# Patient Record
Sex: Female | Born: 1935 | Race: White | Hispanic: No | Marital: Married | Smoking: Never smoker
Health system: Southern US, Community
[De-identification: ages and names within clinical notes are randomized; demographics above are authoritative.]

## PROBLEM LIST (undated history)

## (undated) DIAGNOSIS — Z86718 Personal history of other venous thrombosis and embolism: Secondary | ICD-10-CM

## (undated) DIAGNOSIS — E039 Hypothyroidism, unspecified: Secondary | ICD-10-CM

---

## 2000-06-12 ENCOUNTER — Encounter: Payer: Self-pay | Admitting: Emergency Medicine

## 2000-06-12 ENCOUNTER — Emergency Department (HOSPITAL_COMMUNITY): Admission: EM | Admit: 2000-06-12 | Discharge: 2000-06-12 | Payer: Self-pay | Admitting: Emergency Medicine

## 2004-07-20 ENCOUNTER — Inpatient Hospital Stay: Payer: Self-pay | Admitting: Internal Medicine

## 2004-11-04 ENCOUNTER — Ambulatory Visit: Payer: Self-pay | Admitting: Internal Medicine

## 2005-11-06 ENCOUNTER — Ambulatory Visit: Payer: Self-pay | Admitting: Internal Medicine

## 2006-09-21 ENCOUNTER — Ambulatory Visit: Payer: Self-pay | Admitting: Unknown Physician Specialty

## 2006-11-12 ENCOUNTER — Ambulatory Visit: Payer: Self-pay | Admitting: Internal Medicine

## 2007-11-14 ENCOUNTER — Ambulatory Visit: Payer: Self-pay | Admitting: Internal Medicine

## 2008-11-16 ENCOUNTER — Ambulatory Visit: Payer: Self-pay | Admitting: Internal Medicine

## 2009-11-18 ENCOUNTER — Ambulatory Visit: Payer: Self-pay | Admitting: Internal Medicine

## 2010-11-21 ENCOUNTER — Ambulatory Visit: Payer: Self-pay | Admitting: Internal Medicine

## 2011-11-22 ENCOUNTER — Ambulatory Visit: Payer: Self-pay | Admitting: Internal Medicine

## 2012-05-30 ENCOUNTER — Emergency Department: Payer: Self-pay | Admitting: Emergency Medicine

## 2012-05-30 LAB — COMPREHENSIVE METABOLIC PANEL
Alkaline Phosphatase: 88 U/L (ref 50–136)
Anion Gap: 5 — ABNORMAL LOW (ref 7–16)
BUN: 15 mg/dL (ref 7–18)
EGFR (African American): >60
EGFR (Non-African Amer.): 59 — ABNORMAL LOW
Glucose: 128 mg/dL — ABNORMAL HIGH (ref 65–99)
Osmolality: 282 (ref 275–301)
Potassium: 3.8 mmol/L (ref 3.5–5.1)

## 2012-05-30 LAB — CBC
MCH: 29 pg (ref 26.0–34.0)
MCV: 87 fL (ref 80–100)
Platelet: 198 10*3/uL (ref 150–440)
WBC: 5.7 10*3/uL (ref 3.6–11.0)

## 2012-05-30 LAB — TSH: Thyroid Stimulating Horm: 2.04 u[IU]/mL

## 2012-10-27 ENCOUNTER — Inpatient Hospital Stay (HOSPITAL_COMMUNITY): Payer: Medicare Other | Admitting: Certified Registered"

## 2012-10-27 ENCOUNTER — Inpatient Hospital Stay (HOSPITAL_COMMUNITY)
Admission: EM | Admit: 2012-10-27 | Discharge: 2012-10-29 | DRG: 494 | Disposition: A | Discharge status: Skilled Nursing Facility | Payer: Medicare Other | Attending: Orthopedic Surgery | Admitting: Orthopedic Surgery

## 2012-10-27 ENCOUNTER — Encounter (HOSPITAL_COMMUNITY): Payer: Self-pay | Admitting: Certified Registered"

## 2012-10-27 ENCOUNTER — Encounter (HOSPITAL_COMMUNITY): Payer: Self-pay | Admitting: Physician Assistant

## 2012-10-27 ENCOUNTER — Other Ambulatory Visit: Payer: Self-pay

## 2012-10-27 ENCOUNTER — Emergency Department (HOSPITAL_COMMUNITY): Payer: Medicare Other

## 2012-10-27 ENCOUNTER — Encounter (HOSPITAL_COMMUNITY)
Admission: EM | Disposition: A | Discharge status: Skilled Nursing Facility | Payer: Self-pay | Source: Home / Self Care | Attending: Orthopedic Surgery

## 2012-10-27 DIAGNOSIS — E039 Hypothyroidism, unspecified: Secondary | ICD-10-CM | POA: Insufficient documentation

## 2012-10-27 DIAGNOSIS — F411 Generalized anxiety disorder: Secondary | ICD-10-CM | POA: Diagnosis present

## 2012-10-27 DIAGNOSIS — S82001A Unspecified fracture of right patella, initial encounter for closed fracture: Secondary | ICD-10-CM

## 2012-10-27 DIAGNOSIS — W010XXA Fall on same level from slipping, tripping and stumbling without subsequent striking against object, initial encounter: Secondary | ICD-10-CM | POA: Diagnosis present

## 2012-10-27 DIAGNOSIS — Y9229 Other specified public building as the place of occurrence of the external cause: Secondary | ICD-10-CM

## 2012-10-27 DIAGNOSIS — Z79899 Other long term (current) drug therapy: Secondary | ICD-10-CM

## 2012-10-27 DIAGNOSIS — S82009A Unspecified fracture of unspecified patella, initial encounter for closed fracture: Principal | ICD-10-CM | POA: Diagnosis present

## 2012-10-27 HISTORY — DX: Hypothyroidism, unspecified: E03.9

## 2012-10-27 HISTORY — PX: ORIF PATELLA: SHX5033

## 2012-10-27 LAB — CBC WITH DIFFERENTIAL/PLATELET
Basophils Absolute: 0 10*3/uL (ref 0.0–0.1)
Basophils Relative: 0 % (ref 0–1)
Hemoglobin: 12.6 g/dL (ref 12.0–15.0)
Lymphocytes Relative: 20 % (ref 12–46)
MCHC: 32.7 g/dL (ref 30.0–36.0)
Neutro Abs: 5.1 10*3/uL (ref 1.7–7.7)
Neutrophils Relative %: 74 % (ref 43–77)
RDW: 13.4 % (ref 11.5–15.5)
WBC: 6.8 10*3/uL (ref 4.0–10.5)

## 2012-10-27 LAB — CBC
HCT: 38.8 % (ref 36.0–46.0)
Hemoglobin: 12.4 g/dL (ref 12.0–15.0)
MCH: 26.8 pg (ref 26.0–34.0)
MCHC: 32 g/dL (ref 30.0–36.0)
MCV: 83.8 fL (ref 78.0–100.0)

## 2012-10-27 LAB — APTT: aPTT: 26 seconds (ref 24–37)

## 2012-10-27 LAB — POCT I-STAT, CHEM 8
Calcium, Ion: 1.12 mmol/L — ABNORMAL LOW (ref 1.13–1.30)
HCT: 38 % (ref 36.0–46.0)
Hemoglobin: 12.9 g/dL (ref 12.0–15.0)
TCO2: 23 mmol/L (ref 0–100)

## 2012-10-27 LAB — PROTIME-INR: INR: 1.01 (ref 0.00–1.49)

## 2012-10-27 SURGERY — OPEN REDUCTION INTERNAL FIXATION (ORIF) PATELLA
Anesthesia: General | Site: Knee | Laterality: Right | Wound class: Clean

## 2012-10-27 MED ORDER — FLEET ENEMA 7-19 GM/118ML RE ENEM: 1.0000 | ENEMA | Freq: Once | RECTAL | Status: AC | PRN

## 2012-10-27 MED ORDER — SODIUM CHLORIDE 0.9 % IV SOLN
INTRAVENOUS | Status: DC | PRN
  Administered 2012-10-27: 16:00:00 via INTRAVENOUS

## 2012-10-27 MED ORDER — PROPOFOL 10 MG/ML IV BOLUS
INTRAVENOUS | Status: DC | PRN
  Administered 2012-10-27: 150 mg via INTRAVENOUS
  Administered 2012-10-27: 20 mg via INTRAVENOUS

## 2012-10-27 MED ORDER — HYDROCODONE-ACETAMINOPHEN 5-325 MG PO TABS
1.0000 | ORAL_TABLET | ORAL | Status: DC | PRN
  Administered 2012-10-28 – 2012-10-29 (×5): 1 via ORAL
  Filled 2012-10-27 (×2): qty 1

## 2012-10-27 MED ORDER — HYDROMORPHONE HCL PF 1 MG/ML IJ SOLN: 0.5000 mg | INTRAMUSCULAR | Status: DC | PRN

## 2012-10-27 MED ORDER — LACTATED RINGERS IV SOLN
INTRAVENOUS | Status: DC | PRN
  Administered 2012-10-27: 17:00:00 via INTRAVENOUS

## 2012-10-27 MED ORDER — CEFAZOLIN SODIUM-DEXTROSE 2-3 GM-% IV SOLR
2.0000 g | INTRAVENOUS | Status: DC
  Filled 2012-10-27: qty 50

## 2012-10-27 MED ORDER — LEVOTHYROXINE SODIUM 25 MCG PO TABS
25.0000 ug | ORAL_TABLET | Freq: Every day | ORAL | Status: DC
  Administered 2012-10-28 – 2012-10-29 (×2): 25 ug via ORAL
  Filled 2012-10-27 (×3): qty 1

## 2012-10-27 MED ORDER — SENNOSIDES-DOCUSATE SODIUM 8.6-50 MG PO TABS: 1.0000 | ORAL_TABLET | Freq: Every evening | ORAL | Status: DC | PRN

## 2012-10-27 MED ORDER — ENOXAPARIN SODIUM 40 MG/0.4ML ~~LOC~~ SOLN
40.0000 mg | SUBCUTANEOUS | Status: DC
  Administered 2012-10-28 – 2012-10-29 (×2): 40 mg via SUBCUTANEOUS
  Filled 2012-10-27 (×3): qty 0.4

## 2012-10-27 MED ORDER — LIDOCAINE HCL (CARDIAC) 20 MG/ML IV SOLN
INTRAVENOUS | Status: DC | PRN
  Administered 2012-10-27: 25 mg via INTRAVENOUS

## 2012-10-27 MED ORDER — CEFAZOLIN SODIUM 1-5 GM-% IV SOLN
1.0000 g | Freq: Four times a day (QID) | INTRAVENOUS | Status: AC
  Administered 2012-10-27 – 2012-10-28 (×3): 1 g via INTRAVENOUS
  Filled 2012-10-27 (×3): qty 50

## 2012-10-27 MED ORDER — ONDANSETRON HCL 4 MG/2ML IJ SOLN: 4.0000 mg | Freq: Once | INTRAMUSCULAR | Status: DC | PRN

## 2012-10-27 MED ORDER — LORAZEPAM 0.5 MG PO TABS
0.5000 mg | ORAL_TABLET | Freq: Three times a day (TID) | ORAL | Status: DC | PRN
  Administered 2012-10-27: 0.5 mg via ORAL
  Filled 2012-10-27: qty 1

## 2012-10-27 MED ORDER — FENTANYL CITRATE 0.05 MG/ML IJ SOLN
INTRAMUSCULAR | Status: DC | PRN
  Administered 2012-10-27 (×4): 25 ug via INTRAVENOUS

## 2012-10-27 MED ORDER — ONDANSETRON HCL 4 MG PO TABS: 4.0000 mg | ORAL_TABLET | Freq: Four times a day (QID) | ORAL | Status: DC | PRN

## 2012-10-27 MED ORDER — ONDANSETRON HCL 4 MG/2ML IJ SOLN
4.0000 mg | Freq: Four times a day (QID) | INTRAMUSCULAR | Status: DC | PRN
  Administered 2012-10-27: 4 mg via INTRAVENOUS
  Filled 2012-10-27: qty 2

## 2012-10-27 MED ORDER — BUPIVACAINE HCL (PF) 0.5 % IJ SOLN
INTRAMUSCULAR | Status: AC
  Filled 2012-10-27: qty 30

## 2012-10-27 MED ORDER — BISACODYL 10 MG RE SUPP: 10.0000 mg | Freq: Every day | RECTAL | Status: DC | PRN

## 2012-10-27 MED ORDER — HYDROCODONE-ACETAMINOPHEN 5-325 MG PO TABS
1.0000 | ORAL_TABLET | Freq: Four times a day (QID) | ORAL | Status: DC | PRN
  Filled 2012-10-27 (×3): qty 1

## 2012-10-27 MED ORDER — BUPIVACAINE HCL (PF) 0.5 % IJ SOLN
INTRAMUSCULAR | Status: DC | PRN
  Administered 2012-10-27: 20 mL

## 2012-10-27 MED ORDER — HYDROMORPHONE HCL PF 1 MG/ML IJ SOLN: 0.2500 mg | INTRAMUSCULAR | Status: DC | PRN

## 2012-10-27 MED ORDER — METOCLOPRAMIDE HCL 10 MG PO TABS: 5.0000 mg | ORAL_TABLET | Freq: Three times a day (TID) | ORAL | Status: DC | PRN

## 2012-10-27 MED ORDER — SODIUM CHLORIDE 0.9 % IV SOLN
INTRAVENOUS | Status: DC
  Administered 2012-10-27: 21:00:00 via INTRAVENOUS

## 2012-10-27 MED ORDER — SUFENTANIL CITRATE 50 MCG/ML IV SOLN
INTRAVENOUS | Status: DC | PRN
  Administered 2012-10-27: 10 ug via INTRAVENOUS

## 2012-10-27 MED ORDER — CEFAZOLIN SODIUM-DEXTROSE 2-3 GM-% IV SOLR
INTRAVENOUS | Status: DC | PRN
  Administered 2012-10-27: 2 g via INTRAVENOUS

## 2012-10-27 MED ORDER — ENOXAPARIN SODIUM 40 MG/0.4ML ~~LOC~~ SOLN
40.0000 mg | SUBCUTANEOUS | Status: DC
  Filled 2012-10-27: qty 0.4

## 2012-10-27 MED ORDER — ACETAMINOPHEN 10 MG/ML IV SOLN: 1000.0000 mg | Freq: Once | INTRAVENOUS | Status: DC | PRN

## 2012-10-27 MED ORDER — DOCUSATE SODIUM 100 MG PO CAPS
100.0000 mg | ORAL_CAPSULE | Freq: Two times a day (BID) | ORAL | Status: DC
  Administered 2012-10-27 – 2012-10-29 (×4): 100 mg via ORAL
  Filled 2012-10-27 (×6): qty 1

## 2012-10-27 MED ORDER — METOCLOPRAMIDE HCL 5 MG/ML IJ SOLN: 5.0000 mg | Freq: Three times a day (TID) | INTRAMUSCULAR | Status: DC | PRN

## 2012-10-27 SURGICAL SUPPLY — 66 items
BANDAGE ELASTIC 4 VELCRO ST LF (GAUZE/BANDAGES/DRESSINGS) ×1 IMPLANT
BANDAGE ELASTIC 6 VELCRO ST LF (GAUZE/BANDAGES/DRESSINGS) ×2 IMPLANT
BANDAGE ESMARK 6X9 LF (GAUZE/BANDAGES/DRESSINGS) ×1 IMPLANT
BANDAGE GAUZE ELAST BULKY 4 IN (GAUZE/BANDAGES/DRESSINGS) ×2 IMPLANT
BIT DRILL 7/64X5 DISP (BIT) ×1 IMPLANT
BLADE SURG 10 STRL SS (BLADE) ×2 IMPLANT
BNDG CMPR 9X6 STRL LF SNTH (GAUZE/BANDAGES/DRESSINGS) ×1
BNDG COHESIVE 4X5 TAN STRL (GAUZE/BANDAGES/DRESSINGS) ×3 IMPLANT
BNDG ESMARK 6X9 LF (GAUZE/BANDAGES/DRESSINGS) ×2
CLOTH BEACON ORANGE TIMEOUT ST (SAFETY) ×2 IMPLANT
COVER MAYO STAND STRL (DRAPES) ×1 IMPLANT
CUFF TOURNIQUET SINGLE 34IN LL (TOURNIQUET CUFF) ×1 IMPLANT
CUFF TOURNIQUET SINGLE 44IN (TOURNIQUET CUFF) IMPLANT
DRAPE OEC MINIVIEW 54X84 (DRAPES) ×1 IMPLANT
DRAPE U-SHAPE 47X51 STRL (DRAPES) ×2 IMPLANT
DRSG ADAPTIC 3X8 NADH LF (GAUZE/BANDAGES/DRESSINGS) ×2 IMPLANT
DRSG EMULSION OIL 3X3 NADH (GAUZE/BANDAGES/DRESSINGS) ×1 IMPLANT
DRSG PAD ABDOMINAL 8X10 ST (GAUZE/BANDAGES/DRESSINGS) ×3 IMPLANT
DURAPREP 26ML APPLICATOR (WOUND CARE) ×2 IMPLANT
ELECT REM PT RETURN 9FT ADLT (ELECTROSURGICAL) ×2
ELECTRODE REM PT RTRN 9FT ADLT (ELECTROSURGICAL) ×1 IMPLANT
GLOVE BIOGEL PI IND STRL 8 (GLOVE) ×4 IMPLANT
GLOVE BIOGEL PI INDICATOR 8 (GLOVE) ×4
GLOVE ORTHO TXT STRL SZ7.5 (GLOVE) ×12 IMPLANT
GLOVE SURG ORTHO 8.0 STRL STRW (GLOVE) ×8 IMPLANT
GOWN EXTRA PROTECTION XXL 0583 (GOWNS) ×2 IMPLANT
GOWN PREVENTION PLUS XLARGE (GOWN DISPOSABLE) ×5 IMPLANT
GOWN STRL NON-REIN LRG LVL3 (GOWN DISPOSABLE) ×4 IMPLANT
IMMOBILIZER KNEE 22 UNIV (SOFTGOODS) ×1 IMPLANT
IV CATH 14GX2 1/4 (CATHETERS) ×2 IMPLANT
K-WIRE .062 (WIRE) ×4
K-WIRE DBL TROCAR .062X4 ×4 IMPLANT
K-WIRE FX6X.062X2 END TROC (WIRE) ×2
KIT BASIN OR (CUSTOM PROCEDURE TRAY) ×2 IMPLANT
KIT ROOM TURNOVER OR (KITS) ×2 IMPLANT
KWIRE DBL TROCAR .062X4 IMPLANT
KWIRE FX6X.062X2 END TROC (WIRE) IMPLANT
MANIFOLD NEPTUNE II (INSTRUMENTS) ×1 IMPLANT
NDL MAYO TROCAR (NEEDLE) IMPLANT
NEEDLE MAYO TROCAR (NEEDLE) IMPLANT
NS IRRIG 1000ML POUR BTL (IV SOLUTION) ×2 IMPLANT
PACK ORTHO EXTREMITY (CUSTOM PROCEDURE TRAY) ×2 IMPLANT
PAD ARMBOARD 7.5X6 YLW CONV (MISCELLANEOUS) ×4 IMPLANT
PAD CAST 4YDX4 CTTN HI CHSV (CAST SUPPLIES) IMPLANT
PADDING CAST COTTON 4X4 STRL (CAST SUPPLIES) ×2
PASSER SUT SWANSON 36MM LOOP (INSTRUMENTS) ×1 IMPLANT
SPONGE GAUZE 4X4 12PLY (GAUZE/BANDAGES/DRESSINGS) ×2 IMPLANT
SPONGE LAP 18X18 X RAY DECT (DISPOSABLE) ×3 IMPLANT
STAPLER VISISTAT 35W (STAPLE) ×2 IMPLANT
STOCKINETTE IMPERVIOUS 9X36 MD (GAUZE/BANDAGES/DRESSINGS) ×2 IMPLANT
STOCKINETTE IMPERVIOUS LG (DRAPES) ×1 IMPLANT
SUT ETHIBOND NAB CT1 #1 30IN (SUTURE) ×1 IMPLANT
SUT FIBERWIRE #2 38 REV NDL BL (SUTURE)
SUT STEEL 7 (SUTURE) IMPLANT
SUT VIC AB 0 CT1 27 (SUTURE) ×2
SUT VIC AB 0 CT1 27XBRD ANBCTR (SUTURE) ×1 IMPLANT
SUT VIC AB 2-0 CT1 27 (SUTURE) ×2
SUT VIC AB 2-0 CT1 TAPERPNT 27 (SUTURE) ×1 IMPLANT
SUT WIRE 16GA (Orthopedic Implant) ×1 IMPLANT
SUTURE FIBERWR#2 38 REV NDL BL (SUTURE) IMPLANT
TOWEL OR 17X24 6PK STRL BLUE (TOWEL DISPOSABLE) ×2 IMPLANT
TOWEL OR 17X26 10 PK STRL BLUE (TOWEL DISPOSABLE) ×2 IMPLANT
TUBE CONNECTING 12X1/4 (SUCTIONS) ×2 IMPLANT
UNDERPAD 30X30 INCONTINENT (UNDERPADS AND DIAPERS) ×2 IMPLANT
WATER STERILE IRR 1000ML POUR (IV SOLUTION) ×1 IMPLANT
YANKAUER SUCT BULB TIP NO VENT (SUCTIONS) ×2 IMPLANT

## 2012-10-27 NOTE — Brief Op Note (Signed)
10/27/2012  6:20 PM  PATIENT:  Kimberly Schneider  79 y.o. female  PRE-OPERATIVE DIAGNOSIS:  displaced patella fx  POST-OPERATIVE DIAGNOSIS:  same   PROCEDURE:  Procedure(s): OPEN REDUCTION INTERNAL (ORIF) FIXATION PATELLA (Right)  SURGEON:  Surgeon(s) and Role:    * W D Carloyn Manner., MD - Primary  PHYSICIAN ASSISTANT: Margart Sickles, PA-C  ASSISTANTS: none  ANESTHESIA:   local and general  EBL:  Total I/O In: 650 [I.V.:650] Out: 50 [Blood:50]  BLOOD ADMINISTERED:none  DRAINS: none   LOCAL MEDICATIONS USED:  MARCAINE     SPECIMEN:  No Specimen  DISPOSITION OF SPECIMEN:  N/A  COUNTS:  YES  TOURNIQUET:   Total Tourniquet Time Documented: Thigh (Right) - 59 minutes Total: Thigh (Right) - 59 minutes   DICTATION: .Other Dictation: Dictation Number unknown  PLAN OF CARE: Admit to inpatient   PATIENT DISPOSITION:  PACU - hemodynamically stable.   Delay start of Pharmacological VTE agent (>24hrs) due to surgical blood loss or risk of bleeding: yes

## 2012-10-27 NOTE — Transfer of Care (Signed)
Immediate Anesthesia Transfer of Care Note  Patient: Kimberly Schneider  Procedure(s) Performed: Procedure(s): OPEN REDUCTION INTERNAL (ORIF) FIXATION PATELLA (Right)  Patient Location: PACU  Anesthesia Type:General  Level of Consciousness: sedated  Airway & Oxygen Therapy: Patient Spontanous Breathing and Patient connected to nasal cannula oxygen  Post-op Assessment: Report given to PACU RN and Post -op Vital signs reviewed and stable  Post vital signs: Reviewed and stable  Complications: No apparent anesthesia complications

## 2012-10-27 NOTE — ED Notes (Signed)
Pt reported to EMS that she tripped [place] a mat at [place]. Pt denies LOC and no syncope . Pt' RT knee swollen and blue on arrival to ED. Iv started per EMS.

## 2012-10-27 NOTE — Progress Notes (Signed)
Orthopedic Tech Progress Note Patient Details:  Kimberly Schneider Sep 12, 1921 [id number]  Patient ID: Kimberly Schneider, female   DOB: October 26, 1921, 79 y.o.   MRN: [id number] Trapeze bar patient helper  Nikki Dom 10/27/2012, 10:41 PM

## 2012-10-27 NOTE — Anesthesia Preprocedure Evaluation (Addendum)
Anesthesia Evaluation  Patient identified by MRN, date of birth, ID band Patient awake    Reviewed: Allergy & Precautions, H&P , NPO status , reviewed documented beta blocker date and time   History of Anesthesia Complications Negative for: history of anesthetic complications  Airway Mallampati: II TM Distance: >3 FB Neck ROM: Full    Dental  (+) Teeth Intact and Dental Advisory Given   Pulmonary neg pulmonary ROS,  breath sounds clear to auscultation        Cardiovascular Exercise Tolerance: Good negative cardio ROS  Rhythm:Regular Rate:Normal     Neuro/Psych negative neurological ROS  negative psych ROS   GI/Hepatic negative GI ROS, Neg liver ROS,   Endo/Other    Renal/GU negative Renal ROS  negative genitourinary   Musculoskeletal negative musculoskeletal ROS (+)   Abdominal   Peds  Hematology negative hematology ROS (+)   Anesthesia Other Findings   Reproductive/Obstetrics negative OB ROS                          Anesthesia Physical Anesthesia Plan  ASA: II  Anesthesia Plan: General   Post-op Pain Management:    Induction: Intravenous  Airway Management Planned: LMA  Additional Equipment:   Intra-op Plan:   Post-operative Plan: Extubation in OR  Informed Consent: I have reviewed the patients History and Physical, chart, labs and discussed the procedure including the risks, benefits and alternatives for the proposed anesthesia with the patient or authorized representative who has indicated his/her understanding and acceptance.   Dental advisory given  Plan Discussed with: Anesthesiologist, Surgeon and CRNA  Anesthesia Plan Comments: (79 year old female with R. Patella fracture Hypothyroidism, O/W (-) medical history.  Plan GA with LMA  Kipp Brood, MD)       Anesthesia Quick Evaluation

## 2012-10-27 NOTE — ED Provider Notes (Addendum)
Kimberly Schneider is a 79 y.o. female with h/o/ hypothyroidism presents after mechanical fall at church, denies CP, SOB, dizziness, antecedant illness, fevers or chills.  Pt presents with pain, 1/10 to right knee, worse on movement, obvious swelling.  H/o L knee replacement in [place] in the 1990's. Right knee clearly swollen, with TTP - no ranging attempted.  Right knee XR shows displaced Rt patella Fx.  Discussed with Dr. Madelon Lips, Pre-op labs ordered.  Pt NPO since 0900 this am.  ORIF of R patella today.  Medical screening examination/treatment/procedure(s) were conducted as a shared visit with non-physician practitioner(s) and myself.  I personally evaluated the patient during the encounter Jones Skene, M.D.  Jones Skene, MD 10/27/12 1546    Jones Skene, MD 10/27/12 1610

## 2012-10-27 NOTE — H&P (Signed)
Kimberly Schneider is an 79 y.o. female.   Chief Complaint: right displaced patella fracture closed HPI: 79yo female sustained a fall earlier today at church landing directly on her bent right knee then hitting her head.  Denies LOC or dizziness prior to fall.  She was able to get up and walk following but gait was affected and having pain and decreased ROM right knee.  Taken to [facility] ED xrays right knee showed displaced transverse fracture of patella.  Ortho consulted.  No past medical history on file. Hypothyroidism anxiety  No past surgical history on file.  No family history on file. father and brother hx heart disease, mother diabetes Social History:  has no tobacco, alcohol, and drug history on file. nonsmoker, nondrinker, no recreational drug use, retired, widowed   Allergies: No Known Allergies  Meds:  Synthroid alprazolam  Results for orders placed during the hospital encounter of 10/27/12 (from the past 48 hour(s))  PROTIME-INR     Status: None   Collection Time    10/27/12  2:51 PM      Result Value Range   Prothrombin Time 13.1  11.6 - 15.2 seconds   INR 1.01  0.00 - 1.49  APTT     Status: None   Collection Time    10/27/12  2:51 PM      Result Value Range   aPTT 26  24 - 37 seconds  POCT I-STAT, CHEM 8     Status: Abnormal   Collection Time    10/27/12  3:05 PM      Result Value Range   Sodium 142  135 - 145 mEq/L   Potassium 3.9  3.5 - 5.1 mEq/L   Chloride 107  96 - 112 mEq/L   BUN 14  6 - 23 mg/dL   Creatinine, Ser 1.61  0.50 - 1.10 mg/dL   Glucose, Bld 096 (*) 70 - 99 mg/dL   Calcium, Ion 0.45 (*) 1.13 - 1.30 mmol/L   TCO2 23  0 - 100 mmol/L   Hemoglobin 12.9  12.0 - 15.0 g/dL   HCT 40.9  81.1 - 91.4 %   Ct Head Wo Contrast  10/27/2012   *RADIOLOGY REPORT*  Clinical Data:  Fall.  CT HEAD WITHOUT CONTRAST CT CERVICAL SPINE WITHOUT CONTRAST  Technique:  Multidetector CT imaging of the head and cervical spine was performed following the standard protocol without  intravenous contrast.  Multiplanar CT image reconstructions of the cervical spine were also generated.  Comparison:   None .  CT HEAD  Findings: There is cerebral atrophy.  Diffuse low density in the periventricular and subcortical white matter suggests chronic changes. No evidence for acute hemorrhage, mass lesion, midline shift, hydrocephalus or large infarct.  Visualized paranasal sinuses are clear.  No acute bony abnormality.  IMPRESSION: No acute intracranial abnormality.  Atrophy and evidence of chronic small vessel ischemic changes.  CT CERVICAL SPINE  Findings: Lung apices are clear without pneumothorax.  No evidence for soft tissue swelling or edema.  Multilevel degenerative disc and facet disease.  Marked disc space narrowing between C4-C7.  No evidence for acute fracture or dislocation.  There is mild anterolisthesis at C7-T1 that is probably related to facet disease.  IMPRESSION: Multilevel cervical spondylosis.  No acute bony abnormality.   Original Report Authenticated By: Richarda Overlie, M.D.   Ct Cervical Spine Wo Contrast  10/27/2012   *RADIOLOGY REPORT*  Clinical Data:  Fall.  CT HEAD WITHOUT CONTRAST CT CERVICAL SPINE WITHOUT CONTRAST  Technique:  Multidetector CT imaging of the head and cervical spine was performed following the standard protocol without intravenous contrast.  Multiplanar CT image reconstructions of the cervical spine were also generated.  Comparison:   None .  CT HEAD  Findings: There is cerebral atrophy.  Diffuse low density in the periventricular and subcortical white matter suggests chronic changes. No evidence for acute hemorrhage, mass lesion, midline shift, hydrocephalus or large infarct.  Visualized paranasal sinuses are clear.  No acute bony abnormality.  IMPRESSION: No acute intracranial abnormality.  Atrophy and evidence of chronic small vessel ischemic changes.  CT CERVICAL SPINE  Findings: Lung apices are clear without pneumothorax.  No evidence for soft tissue swelling  or edema.  Multilevel degenerative disc and facet disease.  Marked disc space narrowing between C4-C7.  No evidence for acute fracture or dislocation.  There is mild anterolisthesis at C7-T1 that is probably related to facet disease.  IMPRESSION: Multilevel cervical spondylosis.  No acute bony abnormality.   Original Report Authenticated By: Richarda Overlie, M.D.   Dg Chest Port 1 View  10/27/2012   *RADIOLOGY REPORT*  Clinical Data: Preoperative chest x-ray  PORTABLE CHEST - 1 VIEW  Comparison: None.  Findings: Mild cardiomegaly.  Hiatal hernia.  Negative for pulmonary edema, focal consolidation, pleural effusion or pneumothorax.  No suspicious pulmonary nodule.  Mild osteopenia. Degenerative changes in the right acromioclavicular joint.  No acute osseous abnormality.  IMPRESSION:  1.  No acute cardiopulmonary disease. 2.  Mild cardiomegaly 3.  Hiatal hernia   Original Report Authenticated By: Malachy Moan, M.D.   Dg Knee Complete 4 Views Right  10/27/2012   *RADIOLOGY REPORT*  Clinical Data: Fall and right knee pain.  RIGHT KNEE - COMPLETE 4+ VIEW  Comparison: None.  Findings: There is a markedly displaced fracture through the mid aspect of the patella. There is extensive anterior soft tissue swelling and there is a suprapatellar joint effusion.  The knee is located.  Mild degenerative changes in the knee.  IMPRESSION: Displaced patellar fracture.   Original Report Authenticated By: Richarda Overlie, M.D.    Review of Systems  Constitutional: Positive for weight loss. Negative for fever, chills, malaise/fatigue and diaphoresis.  HENT: Negative.  Negative for neck pain.   Eyes: Negative.   Respiratory: Negative.   Cardiovascular: Negative.   Gastrointestinal: Negative.   Genitourinary: Negative.   Musculoskeletal: Positive for myalgias, joint pain and falls. Negative for back pain.  Skin: Negative.   Neurological: Negative.  Negative for weakness.  Endo/Heme/Allergies: Negative for environmental allergies.  Bruises/bleeds easily.  Psychiatric/Behavioral: Negative for depression, suicidal ideas and substance abuse.    Blood pressure 197/84, pulse 82, temperature 99.1 F (37.3 C), temperature source Oral, resp. rate 18, SpO2 94.00%. Physical Exam  Constitutional: She is oriented to person, place, and time. She appears well-developed and well-nourished. No distress.  HENT:  Head: Normocephalic and atraumatic.  Nose: Nose normal.  Eyes: Conjunctivae and EOM are normal. Pupils are equal, round, and reactive to light.  Neck: Normal range of motion. Neck supple.  Cardiovascular: Normal rate, regular rhythm and normal heart sounds.   Respiratory: Effort normal and breath sounds normal. No respiratory distress. She has no wheezes. She has no rales. She exhibits no tenderness.  GI: Soft. Bowel sounds are normal. She exhibits no distension. There is no tenderness.  Musculoskeletal:       Right knee: She exhibits decreased range of motion, swelling, effusion, ecchymosis and abnormal patellar mobility. She exhibits no laceration, no erythema,  normal alignment, no LCL laxity and no MCL laxity. Tenderness found.  RLE- unable to fully extend the knee significant swelling/effusion R knee stable varus/valgus test, obvious palpable defect of patella  Secondary exam- pt has contusion on right forehead, otherwise negative bilat UE, LLE, neck, good ROM, no tenderness.  Lymphadenopathy:    She has no cervical adenopathy.  Neurological: She is alert and oriented to person, place, and time. No cranial nerve deficit.  Skin: Skin is warm and dry. No rash noted. No erythema.  Psychiatric: She has a normal mood and affect. Her behavior is normal.     Assessment/Plan Right displaced patella fracture closed following a fall today  This is an acute severe injury which carries risk for malunion, nonunion, infection, as well as multiple other potential complications.  Recommend surgical intervention.  Risks and benefits  of ORIF patella discussed and patient wishes to proceed.  She will be admitted for inpatient procedure with estimated length of stay greater than 2 midnights.  DVT proph with lovenox to start tomorrow, pain meds as ordered.  Will be NWB RLE until surgery.  Margart Sickles 10/27/2012, 3:41 PM

## 2012-10-27 NOTE — ED Provider Notes (Signed)
History    CSN: [id number] Arrival date & time 10/27/12  1212  First MD Initiated Contact with Patient 10/27/12 1300     Chief Complaint  Patient presents with  . Fall  . Knee Injury   (Consider location/radiation/quality/duration/timing/severity/associated sxs/prior Treatment) HPI Comments: Patient is an 79 year old female who presents for right knee pain after a mechanical fall at church. Patient states that she was walking into church when her foot caught on a mat causing her to fall forward onto her knees. Patient subsequently had her head on the door in front of her; denies loss of consciousness. Patient states pain is 1/10 and nonradiating, worse with palpation and improved with nonmovement and ice. Patient declines pain medicine in ED at this time. She further denies headache, vision changes, tinnitus or hearing loss, difficulty speaking or swallowing, nausea or vomiting, numbness or tingling, and extremity weakness. Denies use of blood thinners and does not take daily aspirin. Hx of L knee replacement in [place] >15 years ago.  The history is provided by the patient. No language interpreter was used.   No past medical history on file. No past surgical history on file. No family history on file. History  Substance Use Topics  . Smoking status: Not on file  . Smokeless tobacco: Not on file  . Alcohol Use: Not on file   OB History   No data available     Review of Systems  Constitutional: Negative for fever.  Eyes: Negative for visual disturbance.  Gastrointestinal: Negative for nausea and vomiting.  Musculoskeletal: Positive for joint swelling and arthralgias.  Skin: Negative for pallor.  Neurological: Negative for weakness, numbness and headaches.  All other systems reviewed and are negative.   Allergies  Review of patient's allergies indicates no known allergies.  Home Medications   Current Outpatient Rx  Name  Route  Sig  Dispense  Refill  . levothyroxine  (SYNTHROID, LEVOTHROID) 25 MCG tablet   Oral   Take 25 mcg by mouth daily before breakfast.         . LORazepam (ATIVAN) 0.5 MG tablet   Oral   Take 0.5 mg by mouth every 8 (eight) hours as needed for anxiety.          BP 197/84  Pulse 82  Temp(Src) 99.1 F (37.3 C) (Oral)  Resp 18  SpO2 94%  Physical Exam  Nursing note and vitals reviewed. Constitutional: She is oriented to person, place, and time. She appears well-developed and well-nourished. No distress.  HENT:  Head: Normocephalic. Head is with contusion.    Mouth/Throat: Oropharynx is clear and moist. No oropharyngeal exudate.  Eyes: Conjunctivae and EOM are normal. Pupils are equal, round, and reactive to light. No scleral icterus.  Neck: Normal range of motion.  Cardiovascular: Normal rate, regular rhythm, normal heart sounds and intact distal pulses.   Dorsalis pedis and posterior tibial pulses 2+ bilaterally. Capillary refill normal.  Pulmonary/Chest: Effort normal and breath sounds normal. No respiratory distress. She has no wheezes. She has no rales.  Abdominal: Soft. She exhibits no distension. There is no tenderness.  Musculoskeletal:       Right knee: She exhibits decreased range of motion, swelling, ecchymosis, abnormal patellar mobility and bony tenderness. She exhibits no erythema and normal alignment. Tenderness found.       Left upper leg: Normal.       Left lower leg: Normal.  Neurological: She is alert and oriented to person, place, and time. She has normal reflexes.  Cranial nerves III through XII grossly intact. Patient is equal grip strength bilaterally with 5 out of 5 strength against resistance in her upper and lower extremities. DTRs normal and symmetric. No sensory or motor deficits appreciated.  Skin: Skin is warm and dry. No rash noted. She is not diaphoretic. No pallor.  Psychiatric: She has a normal mood and affect. Her behavior is normal.    ED Course  Procedures (including critical care  time) Labs Reviewed  POCT I-STAT, CHEM 8 - Abnormal; Notable for the following:    Glucose, Bld 117 (*)    Calcium, Ion 1.12 (*)    All other components within normal limits  PROTIME-INR  APTT  CBC WITH DIFFERENTIAL   Ct Head Wo Contrast  10/27/2012   *RADIOLOGY REPORT*  Clinical Data:  Fall.  CT HEAD WITHOUT CONTRAST CT CERVICAL SPINE WITHOUT CONTRAST  Technique:  Multidetector CT imaging of the head and cervical spine was performed following the standard protocol without intravenous contrast.  Multiplanar CT image reconstructions of the cervical spine were also generated.  Comparison:   None .  CT HEAD  Findings: There is cerebral atrophy.  Diffuse low density in the periventricular and subcortical white matter suggests chronic changes. No evidence for acute hemorrhage, mass lesion, midline shift, hydrocephalus or large infarct.  Visualized paranasal sinuses are clear.  No acute bony abnormality.  IMPRESSION: No acute intracranial abnormality.  Atrophy and evidence of chronic small vessel ischemic changes.  CT CERVICAL SPINE  Findings: Lung apices are clear without pneumothorax.  No evidence for soft tissue swelling or edema.  Multilevel degenerative disc and facet disease.  Marked disc space narrowing between C4-C7.  No evidence for acute fracture or dislocation.  There is mild anterolisthesis at C7-T1 that is probably related to facet disease.  IMPRESSION: Multilevel cervical spondylosis.  No acute bony abnormality.   Original Report Authenticated By: Richarda Overlie, M.D.   Ct Cervical Spine Wo Contrast  10/27/2012   *RADIOLOGY REPORT*  Clinical Data:  Fall.  CT HEAD WITHOUT CONTRAST CT CERVICAL SPINE WITHOUT CONTRAST  Technique:  Multidetector CT imaging of the head and cervical spine was performed following the standard protocol without intravenous contrast.  Multiplanar CT image reconstructions of the cervical spine were also generated.  Comparison:   None .  CT HEAD  Findings: There is cerebral  atrophy.  Diffuse low density in the periventricular and subcortical white matter suggests chronic changes. No evidence for acute hemorrhage, mass lesion, midline shift, hydrocephalus or large infarct.  Visualized paranasal sinuses are clear.  No acute bony abnormality.  IMPRESSION: No acute intracranial abnormality.  Atrophy and evidence of chronic small vessel ischemic changes.  CT CERVICAL SPINE  Findings: Lung apices are clear without pneumothorax.  No evidence for soft tissue swelling or edema.  Multilevel degenerative disc and facet disease.  Marked disc space narrowing between C4-C7.  No evidence for acute fracture or dislocation.  There is mild anterolisthesis at C7-T1 that is probably related to facet disease.  IMPRESSION: Multilevel cervical spondylosis.  No acute bony abnormality.   Original Report Authenticated By: Richarda Overlie, M.D.   Dg Chest Port 1 View  10/27/2012   *RADIOLOGY REPORT*  Clinical Data: Preoperative chest x-ray  PORTABLE CHEST - 1 VIEW  Comparison: None.  Findings: Mild cardiomegaly.  Hiatal hernia.  Negative for pulmonary edema, focal consolidation, pleural effusion or pneumothorax.  No suspicious pulmonary nodule.  Mild osteopenia. Degenerative changes in the right acromioclavicular joint.  No acute osseous abnormality.  IMPRESSION:  1.  No acute cardiopulmonary disease. 2.  Mild cardiomegaly 3.  Hiatal hernia   Original Report Authenticated By: Malachy Moan, M.D.   Dg Knee Complete 4 Views Right  10/27/2012   *RADIOLOGY REPORT*  Clinical Data: Fall and right knee pain.  RIGHT KNEE - COMPLETE 4+ VIEW  Comparison: None.  Findings: There is a markedly displaced fracture through the mid aspect of the patella. There is extensive anterior soft tissue swelling and there is a suprapatellar joint effusion.  The knee is located.  Mild degenerative changes in the knee.  IMPRESSION: Displaced patellar fracture.   Original Report Authenticated By: Richarda Overlie, M.D.    1. Patellar fracture,  right, closed, initial encounter     MDM  Patient presents for right knee pain after a mechanical fall at church. Patient neurovascularly intact. There is no pallor, pulselessness, poikilothermia, or paresthesias appreciated. X-ray significant for displaced patellar fracture. Dr. Rulon Abide spoke with Dr. Madelon Lips who agrees patient requires surgical repair; will call back regarding whether or not patient can be taken to the OR today.  Patient to go to OR today for repair; CXR, EKG, PT-INR, APTT, CBC, and Chem 8 ordered.    Date: 10/27/2012  Rate: 82  Rhythm: normal sinus rhythm  QRS Axis: normal  Intervals: normal  ST/T Wave abnormalities: normal  Conduction Disutrbances:none  Narrative Interpretation: NSR; no STEMI  Old EKG Reviewed: none available I have personally reviewed and interpreted this EKG   Antony Madura, PA-C 10/27/12 1540

## 2012-10-27 NOTE — Anesthesia Postprocedure Evaluation (Signed)
  Anesthesia Post-op Note  Patient: Kimberly Schneider  Procedure(s) Performed: Procedure(s): OPEN REDUCTION INTERNAL (ORIF) FIXATION PATELLA (Right)  Patient Location: PACU  Anesthesia Type:General  Level of Consciousness: awake, alert  and oriented  Airway and Oxygen Therapy: Patient Spontanous Breathing and Patient connected to nasal cannula oxygen  Post-op Pain: mild  Post-op Assessment: Post-op Vital signs reviewed, Patient's Cardiovascular Status Stable, Respiratory Function Stable, Patent Airway and Pain level controlled  Post-op Vital Signs: stable  Complications: No apparent anesthesia complications

## 2012-10-28 ENCOUNTER — Encounter (HOSPITAL_COMMUNITY): Payer: Self-pay | Admitting: Orthopedic Surgery

## 2012-10-28 LAB — COMPREHENSIVE METABOLIC PANEL
Alkaline Phosphatase: 79 U/L (ref 39–117)
BUN: 10 mg/dL (ref 6–23)
Chloride: 102 mEq/L (ref 96–112)
GFR calc Af Amer: 72 mL/min — ABNORMAL LOW (ref >90)
Glucose, Bld: 155 mg/dL — ABNORMAL HIGH (ref 70–99)
Potassium: 4.5 mEq/L (ref 3.5–5.1)
Total Bilirubin: 0.4 mg/dL (ref 0.3–1.2)

## 2012-10-28 NOTE — Progress Notes (Signed)
UR COMPLETED  

## 2012-10-28 NOTE — Evaluation (Signed)
Physical Therapy Evaluation Patient Details Name: Kimberly Schneider MRN: [id number] DOB: November 28, 1921 Today's Date: 10/28/2012 Time: 7829-5621 PT Time Calculation (min): 29 min  PT Assessment / Plan / Recommendation History of Present Illness  79 y.o. s/p ORIF Rt patella.  Clinical Impression  Pt admitted with patellar fx, now, s/p ORIF. Pt currently with functional limitations due to the deficits listed below (see PT Problem List).  Pt will benefit from skilled PT to increase their independence and safety with mobility to allow discharge to the venue listed below.   Pt will look into getting prn assist at home      PT Assessment  Patient needs continued PT services    Follow Up Recommendations  Home health PT;Supervision/Assistance - 24 hour    Does the patient have the potential to tolerate intense rehabilitation      Barriers to Discharge Decreased caregiver support Pt is looking into having caregivers come in; She will likely progress well enough to be modified independent for an hour or two at a time    Equipment Recommendations  Rolling walker with 5" wheels;3in1 (PT)    Recommendations for Other Services     Frequency Min 6X/week    Precautions / Restrictions Precautions Precautions: Fall;Knee Precaution Comments: No knee ROM Required Braces or Orthoses: Knee Immobilizer - Right Knee Immobilizer - Right: On at all times Restrictions Weight Bearing Restrictions: Yes RLE Weight Bearing: Weight bearing as tolerated   Pertinent Vitals/Pain 5-6/10 pain R knee patient repositioned for comfort]         Mobility  Bed Mobility Bed Mobility: Supine to Sit Supine to Sit: 4: Min assist Details for Bed Mobility Assistance: Cues for technique Transfers Transfers: Sit to Stand;Stand to Sit Sit to Stand: 4: Min assist;With upper extremity assist;From bed Stand to Sit: With upper extremity assist;To chair/3-in-1;4: Min guard Details for Transfer Assistance: Cues for  technique and hand placement Ambulation/Gait Ambulation/Gait Assistance: 4: Min guard Ambulation Distance (Feet): 15 Feet Assistive device: Rolling walker Ambulation/Gait Assistance Details: Cues for gait sequence and posture Gait Pattern: Step-to pattern    Exercises     PT Diagnosis: Difficulty walking  PT Problem List: Decreased range of motion;Decreased activity tolerance;Decreased mobility;Decreased knowledge of use of DME;Decreased knowledge of precautions;Pain PT Treatment Interventions: DME instruction;Gait training;Stair training;Functional mobility training;Therapeutic activities;Therapeutic exercise;Balance training;Patient/family education     PT Goals(Current goals can be found in the care plan section) Acute Rehab PT Goals Patient Stated Goal: heal PT Goal Formulation: With patient Time For Goal Achievement: 11/11/12 Potential to Achieve Goals: Good  Visit Information  Last PT Received On: 10/28/12 Assistance Needed: +1 PT/OT Co-Evaluation/Treatment: Yes History of Present Illness: 79 y.o. s/p ORIF Rt patella.       Prior Functioning  Home Living Family/patient expects to be discharged to:: Private residence Living Arrangements: Alone Available Help at Discharge:  (thinking of getting a caregiver to stay with her) Type of Home: House Home Access: Stairs to enter [employer] of Steps: 2 Entrance Stairs-Rails: None Home Layout: One level Home Equipment: None (unsure if she has walker) Prior Function Level of Independence: Independent Communication Communication: No difficulties Dominant Hand: Right    Cognition  Cognition Arousal/Alertness: Awake/alert Behavior During Therapy: WFL for tasks assessed/performed Overall Cognitive Status: Within Functional Limits for tasks assessed    Extremity/Trunk Assessment Upper Extremity Assessment Upper Extremity Assessment: Overall WFL for tasks assessed Lower Extremity Assessment Lower Extremity  Assessment: RLE deficits/detail RLE Deficits / Details: Immobilized in KI; ankle, hip [facility]  Balance    End of Session PT - End of Session Equipment Utilized During Treatment: Gait belt;Right knee immobilizer Activity Tolerance: Patient tolerated treatment well Patient left: in chair;with call bell/phone within reach Nurse Communication: Mobility status  GP     Van Clines [facility] [place], [place] [phone]  10/28/2012, 11:27 AM

## 2012-10-28 NOTE — Progress Notes (Signed)
Physical Therapy Note   PT continuing to follow  Pt is requiring assist for mobility, and she lives alone  Must be modified independent with functional mobility to dc home  SNF for short-term rehab is indicated to maximize independence and safety with mobility and ADLs prior to dc home   10/28/12 1200  PT - Assessment/Plan  PT Plan Discharge plan needs to be updated  PT Frequency Min 6X/week  Recommendations for Other Services Other (comment) (SW to facilitate dc to SNF for short-term Rehab)  Follow Up Recommendations SNF   [place], [place] [phone]

## 2012-10-28 NOTE — Progress Notes (Signed)
Nutrition Brief Note  Patient identified on the Malnutrition Screening Tool (MST) Report  Pt reports 5 lb weight loss PTA due to increased activity in her yard. Pt reports good appetite eating 2-3 meals per day.  Pt tolerated Breakfast this am but did not eat much due to it being cold.  Encouraged good nutrition for healing.   Body mass index is 22.91 kg/(m^2). BMI WNL.  Current diet order is Regular. Labs and medications reviewed.   No nutrition interventions warranted at this time. If nutrition issues arise, please consult RD.   Kendell Bane RD, LDN, CNSC [phone] Pager [phone] After Hours Pager'

## 2012-10-28 NOTE — Progress Notes (Signed)
Orthopedic Tech Progress Note Patient Details:  Kimberly Schneider Nov 22, 1921 [id number] This is a replacement knee immobilizer first one was soiled. Ortho Devices Type of Ortho Device: Knee Immobilizer Ortho Device/Splint Location: RLE Ortho Device/Splint Interventions: Ordered;Application   Jennye Moccasin 10/28/2012, 10:04 PM

## 2012-10-28 NOTE — Evaluation (Signed)
Occupational Therapy Evaluation Patient Details Name: Kimberly Schneider MRN: [id number] DOB: 1922-02-26 Today's Date: 10/28/2012 Time: 7846-9629 OT Time Calculation (min): 27 min  OT Assessment / Plan / Recommendation History of present illness 79 y.o. s/p ORIF Rt patella.   Clinical Impression   Pt admitted with patellar fx, now, s/p ORIF. Pt currently presents with deficits listed below (see OT Problem List). Pt will benefit from skilled OT to increase their independence and safety to allow discharge to the venue listed below.     OT Assessment  Patient needs continued OT Services    Follow Up Recommendations  SNF;Supervision/Assistance - 24 hour    Barriers to Discharge      Equipment Recommendations  Other (comment) (defer to snf)    Recommendations for Other Services    Frequency  Min 2X/week    Precautions / Restrictions Precautions Precautions: Fall;Knee Precaution Comments: No knee ROM Required Braces or Orthoses: Knee Immobilizer - Right Knee Immobilizer - Right: On at all times Restrictions Weight Bearing Restrictions: Yes RLE Weight Bearing: Weight bearing as tolerated   Pertinent Vitals/Pain 5-6/10 pain R knee  patient repositioned for comfort     ADL  Eating/Feeding: Independent Where Assessed - Eating/Feeding: Chair Grooming: Set up Where Assessed - Grooming: Supported sitting Upper Body Bathing: Set up Where Assessed - Upper Body Bathing: Supported sitting Lower Body Bathing: Moderate assistance Where Assessed - Lower Body Bathing: Supported sit to stand Where Assessed - Upper Body Dressing: Supported sitting Lower Body Dressing: Moderate assistance Where Assessed - Lower Body Dressing: Supported sit to Pharmacist, hospital: [employer] Method: Sit to Barista: Other (comment) (from bed to recliner chair) Tub/Shower Transfer Method: Not assessed Equipment Used: Gait belt;Knee Immobilizer;Rolling  walker Transfers/Ambulation Related to ADLs: Min A ADL Comments: Pt at overall Mod A level for LB ADLs. OT explained that next session will focus on AE to use for LB ADLs.     OT Diagnosis: Acute pain  OT Problem List: Decreased strength;Decreased range of motion;Decreased activity tolerance;Impaired balance (sitting and/or standing);Decreased knowledge of use of DME or AE;Decreased knowledge of precautions;Pain OT Treatment Interventions: Self-care/ADL training;DME and/or AE instruction;Therapeutic activities;Patient/family education;Balance training   OT Goals(Current goals can be found in the care plan section) Acute Rehab OT Goals Patient Stated Goal: heal OT Goal Formulation: With patient Time For Goal Achievement: 11/04/12 Potential to Achieve Goals: Good ADL Goals Pt Will Perform Grooming: with modified independence;standing Pt Will Perform Lower Body Bathing: with modified independence;sit to/from stand;with adaptive equipment Pt Will Perform Lower Body Dressing: with modified independence;with adaptive equipment;sit to/from stand Pt Will Transfer to Toilet: with modified independence;ambulating;bedside commode Pt Will Perform Toileting - Clothing Manipulation and hygiene: with modified independence;sit to/from stand Pt Will Perform Tub/Shower Transfer: with supervision;ambulating;3 in 1;shower seat  Visit Information  Last OT Received On: 10/28/12 Assistance Needed: +1 PT/OT Co-Evaluation/Treatment: Yes History of Present Illness: 79 y.o. s/p ORIF Rt patella.       Prior Functioning     Home Living Family/patient expects to be discharged to:: Private residence Living Arrangements: Alone Available Help at Discharge:  (thinking of getting a caregiver to stay with her) Type of Home: House Home Access: Stairs to enter [employer] of Steps: 2 Entrance Stairs-Rails: None Home Layout: One level Home Equipment: None (unsure if she has walker) Prior  Function Level of Independence: Independent Communication Communication: No difficulties Dominant Hand: Right         Vision/Perception  Cognition  Cognition Arousal/Alertness: Awake/alert Behavior During Therapy: WFL for tasks assessed/performed Overall Cognitive Status: Within Functional Limits for tasks assessed    Extremity/Trunk Assessment Upper Extremity Assessment Upper Extremity Assessment: Overall WFL for tasks assessed     Mobility Bed Mobility Bed Mobility: Supine to Sit Supine to Sit: 4: Min assist Details for Bed Mobility Assistance: Cues for technique Transfers Transfers: Sit to Stand;Stand to Sit Sit to Stand: 4: Min assist;With upper extremity assist;From bed Stand to Sit: With upper extremity assist;To chair/3-in-1;4: Min guard Details for Transfer Assistance: Cues for technique and hand placement     Exercise     Balance     End of Session OT - End of Session Equipment Utilized During Treatment: Gait belt;Rolling walker;Right knee immobilizer Activity Tolerance: Patient tolerated treatment well Patient left: in chair;with call bell/phone within reach  [employer] OTR/L [phone] 10/28/2012, 12:37 PM

## 2012-10-28 NOTE — Progress Notes (Signed)
Rehab Admissions Coordinator Note:  Patient was screened by Brock Ra for appropriateness for an Inpatient Acute Rehab Consult.  Txs recommend SNF vs home w/ 24/7.  At this time, we are recommending Skilled Nursing Facility vs home w/ adequate 24/7 Assist.  Brock Ra 10/28/2012, 1:39 PM  I can be reached at [phone].

## 2012-10-28 NOTE — Progress Notes (Signed)
Subjective: 1 Day Post-Op Procedure(s) (LRB): OPEN REDUCTION INTERNAL (ORIF) FIXATION PATELLA (Right) Patient reports pain as mild.    Objective: Vital signs in last 24 hours: Temp:  [97.7 F (36.5 C)-99.5 F (37.5 C)] 98.3 F (36.8 C) (07/07 1331) Pulse Rate:  [57-88] 77 (07/07 1331) Resp:  [8-18] 18 (07/07 1331) BP: (115-188)/(46-80) 115/46 mmHg (07/07 1331) SpO2:  [95 %-99 %] 99 % (07/07 1331) Weight:  [54.432 kg (120 lb)-56.836 kg (125 lb 4.8 oz)] 56.836 kg (125 lb 4.8 oz) (07/06 2002)  Intake/Output from previous day: 07/06 0701 - 07/07 0700 In: 970 [P.O.:320; I.V.:650] Out: 400 [Urine:300; Emesis/NG output:50; Blood:50] Intake/Output this shift: Total I/O In: 600 [P.O.:600] Out: -    Recent Labs  10/27/12 1451 10/27/12 1505 10/27/12 1949  HGB 12.6 12.9 12.4    Recent Labs  10/27/12 1451 10/27/12 1505 10/27/12 1949  WBC 6.8  --  9.1  RBC 4.69  --  4.63  HCT 38.5 38.0 38.8  PLT 207  --  202    Recent Labs  10/27/12 1505 10/27/12 1949 10/28/12 0500  NA 142  --  136  K 3.9  --  4.5  CL 107  --  102  CO2  --   --  29  BUN 14  --  10  CREATININE 0.90 0.82 0.81  GLUCOSE 117*  --  155*  CALCIUM  --   --  8.3*    Recent Labs  10/27/12 1451  INR 1.01    Sensation intact distally Intact pulses distally Dorsiflexion/Plantar flexion intact Incision: dressing C/D/I Compartment soft  Assessment/Plan: 1 Day Post-Op Procedure(s) (LRB): OPEN REDUCTION INTERNAL (ORIF) FIXATION PATELLA (Right) Up with therapy WBAT RLE in knee immobilizer, no knee ROM dvt proph lovenox daily x2 weeks Pain control as needed Dressing change tomorrow Discharge planning likely SNF d/c tomorrow  Margart Sickles 10/28/2012, 1:58 PM

## 2012-10-29 ENCOUNTER — Encounter (HOSPITAL_COMMUNITY): Payer: Self-pay | Admitting: Orthopedic Surgery

## 2012-10-29 ENCOUNTER — Encounter: Payer: Self-pay | Admitting: Internal Medicine

## 2012-10-29 LAB — COMPREHENSIVE METABOLIC PANEL
AST: 19 U/L (ref 0–37)
Albumin: 2.9 g/dL — ABNORMAL LOW (ref 3.5–5.2)
CO2: 27 mEq/L (ref 19–32)
Calcium: 8.5 mg/dL (ref 8.4–10.5)
Creatinine, Ser: 0.75 mg/dL (ref 0.50–1.10)
GFR calc non Af Amer: 73 mL/min — ABNORMAL LOW (ref >90)
Sodium: 137 mEq/L (ref 135–145)
Total Protein: 6 g/dL (ref 6.0–8.3)

## 2012-10-29 MED ORDER — DSS 100 MG PO CAPS: 100.0000 mg | ORAL_CAPSULE | Freq: Two times a day (BID) | ORAL | Status: AC

## 2012-10-29 MED ORDER — LORAZEPAM 0.5 MG PO TABS: 0.5000 mg | ORAL_TABLET | Freq: Three times a day (TID) | ORAL | Status: DC | PRN

## 2012-10-29 MED ORDER — ENOXAPARIN SODIUM 40 MG/0.4ML ~~LOC~~ SOLN: 40.0000 mg | SUBCUTANEOUS | Status: DC

## 2012-10-29 MED ORDER — ACETAMINOPHEN 325 MG PO TABS: 650.0000 mg | ORAL_TABLET | Freq: Four times a day (QID) | ORAL | Status: AC | PRN

## 2012-10-29 MED ORDER — LEVOTHYROXINE SODIUM 25 MCG PO TABS: 25.0000 ug | ORAL_TABLET | Freq: Every day | ORAL | Status: AC

## 2012-10-29 MED ORDER — HYDROCODONE-ACETAMINOPHEN 5-325 MG PO TABS: 1.0000 | ORAL_TABLET | Freq: Four times a day (QID) | ORAL | Status: DC | PRN

## 2012-10-29 NOTE — Progress Notes (Signed)
Subjective: 2 Days Post-Op Procedure(s) (LRB): OPEN REDUCTION INTERNAL (ORIF) FIXATION PATELLA (Right) Patient reports pain as mild.    Objective: Vital signs in last 24 hours: Temp:  [98.3 F (36.8 C)-99.2 F (37.3 C)] 99.2 F (37.3 C) (07/07 2207) Pulse Rate:  [77-78] 78 (07/07 2207) Resp:  [17-18] 17 (07/08 0400) BP: (115-157)/(46-90) 157/90 mmHg (07/07 2207) SpO2:  [96 %-100 %] 98 % (07/08 0400)  Intake/Output from previous day: 07/07 0701 - 07/08 0700 In: 1655 [P.O.:800; I.V.:855] Out: -  Intake/Output this shift: Total I/O In: 150 [P.O.:150] Out: -    Recent Labs  10/27/12 1451 10/27/12 1505 10/27/12 1949  HGB 12.6 12.9 12.4    Recent Labs  10/27/12 1451 10/27/12 1505 10/27/12 1949  WBC 6.8  --  9.1  RBC 4.69  --  4.63  HCT 38.5 38.0 38.8  PLT 207  --  202    Recent Labs  10/28/12 0500 10/29/12 0630  NA 136 137  K 4.5 3.5  CL 102 103  CO2 29 27  BUN 10 9  CREATININE 0.81 0.75  GLUCOSE 155* 127*  CALCIUM 8.3* 8.5    Recent Labs  10/27/12 1451  INR 1.01    Neurovascular intact Sensation intact distally Intact pulses distally Dorsiflexion/Plantar flexion intact Incision: dressing C/D/I and scant drainage No cellulitis present Compartment soft  Assessment/Plan: 2 Days Post-Op Procedure(s) (LRB): OPEN REDUCTION INTERNAL (ORIF) FIXATION PATELLA (Right) Up with therapy Discharge to SNF  Alayna Mabe 10/29/2012, 8:54 AM

## 2012-10-29 NOTE — Discharge Summary (Signed)
PATIENT ID: Kimberly Schneider        MRN:  [id number]          DOB/AGE: 77-Apr-1923 / 77 y.o.    DISCHARGE SUMMARY  ADMISSION DATE:    10/27/2012 DISCHARGE DATE:   10/29/2012   ADMISSION DIAGNOSIS: Patellar fracture, right, closed, initial encounter [822.0]    DISCHARGE DIAGNOSIS:  displaced patella fx    ADDITIONAL DIAGNOSIS: Principal Problem:   Closed fracture of right patella  Past Medical History  Diagnosis Date  . Hypothyroidism     PROCEDURE: Procedure(s): OPEN REDUCTION INTERNAL (ORIF) FIXATION PATELLA  Right  on 10/27/2012  CONSULTS:     HISTORY: see H&P   HOSPITAL COURSE:  Kimberly Schneider is a 79 y.o. admitted on 10/27/2012 and found to have a diagnosis of displaced patella fx.  After appropriate laboratory studies were obtained  they were taken to the operating room on 10/27/2012 and underwent  Right Procedure(s): OPEN REDUCTION INTERNAL (ORIF) FIXATION PATELLA.   They were given perioperative antibiotics:  Anti-infectives   Start     Dose/Rate Route Frequency Ordered Stop   10/28/12 0600  ceFAZolin (ANCEF) IVPB 2 g/50 mL premix  Status:  Discontinued     2 g 100 mL/hr over 30 Minutes Intravenous On call to O.R. 10/27/12 1914 10/27/12 1918   10/27/12 2300  ceFAZolin (ANCEF) IVPB 1 g/50 mL premix     1 g 100 mL/hr over 30 Minutes Intravenous Every 6 hours 10/27/12 1914 10/28/12 1117    .  Tolerated the procedure well.    POD #1, allowed out of bed to a chair.  PT for ambulation and exercise program.   POD #2, continued PT and ambulation. Dressing changed.   The remainder of the hospital course was dedicated to ambulation and strengthening.   The patient was discharged on 2 Days Post-Op in  Stable condition.  Blood products given:  none  DIAGNOSTIC STUDIES: Recent vital signs: Patient Vitals for the past 24 hrs:  BP Temp Pulse Resp SpO2  10/29/12 0400 - - - 17 98 %  10/29/12 0000 - - - 17 100 %  10/28/12 2207 157/90 mmHg 99.2 F (37.3 C) 78 18 96 %  10/28/12  2000 - - - 17 96 %  10/28/12 1600 - - - 18 -  10/28/12 1331 115/46 mmHg 98.3 F (36.8 C) 77 18 99 %  10/28/12 1200 - - - 18 -       Recent laboratory studies:  Recent Labs  10/27/12 1451 10/27/12 1505 10/27/12 1949  WBC 6.8  --  9.1  HGB 12.6 12.9 12.4  HCT 38.5 38.0 38.8  PLT 207  --  202    Recent Labs  10/27/12 1505 10/27/12 1949 10/28/12 0500 10/29/12 0630  NA 142  --  136 137  K 3.9  --  4.5 3.5  CL 107  --  102 103  CO2  --   --  29 27  BUN 14  --  10 9  CREATININE 0.90 0.82 0.81 0.75  GLUCOSE 117*  --  155* 127*  CALCIUM  --   --  8.3* 8.5   Lab Results  Component Value Date   INR 1.01 10/27/2012     Recent Radiographic Studies :  Ct Head Wo Contrast  10/27/2012   *RADIOLOGY REPORT*  Clinical Data:  Fall.  CT HEAD WITHOUT CONTRAST CT CERVICAL SPINE WITHOUT CONTRAST  Technique:  Multidetector CT imaging of the head and cervical spine  was performed following the standard protocol without intravenous contrast.  Multiplanar CT image reconstructions of the cervical spine were also generated.  Comparison:   None .  CT HEAD  Findings: There is cerebral atrophy.  Diffuse low density in the periventricular and subcortical white matter suggests chronic changes. No evidence for acute hemorrhage, mass lesion, midline shift, hydrocephalus or large infarct.  Visualized paranasal sinuses are clear.  No acute bony abnormality.  IMPRESSION: No acute intracranial abnormality.  Atrophy and evidence of chronic small vessel ischemic changes.  CT CERVICAL SPINE  Findings: Lung apices are clear without pneumothorax.  No evidence for soft tissue swelling or edema.  Multilevel degenerative disc and facet disease.  Marked disc space narrowing between C4-C7.  No evidence for acute fracture or dislocation.  There is mild anterolisthesis at C7-T1 that is probably related to facet disease.  IMPRESSION: Multilevel cervical spondylosis.  No acute bony abnormality.   Original Report Authenticated By:  Richarda Overlie, M.D.   Ct Cervical Spine Wo Contrast  10/27/2012   *RADIOLOGY REPORT*  Clinical Data:  Fall.  CT HEAD WITHOUT CONTRAST CT CERVICAL SPINE WITHOUT CONTRAST  Technique:  Multidetector CT imaging of the head and cervical spine was performed following the standard protocol without intravenous contrast.  Multiplanar CT image reconstructions of the cervical spine were also generated.  Comparison:   None .  CT HEAD  Findings: There is cerebral atrophy.  Diffuse low density in the periventricular and subcortical white matter suggests chronic changes. No evidence for acute hemorrhage, mass lesion, midline shift, hydrocephalus or large infarct.  Visualized paranasal sinuses are clear.  No acute bony abnormality.  IMPRESSION: No acute intracranial abnormality.  Atrophy and evidence of chronic small vessel ischemic changes.  CT CERVICAL SPINE  Findings: Lung apices are clear without pneumothorax.  No evidence for soft tissue swelling or edema.  Multilevel degenerative disc and facet disease.  Marked disc space narrowing between C4-C7.  No evidence for acute fracture or dislocation.  There is mild anterolisthesis at C7-T1 that is probably related to facet disease.  IMPRESSION: Multilevel cervical spondylosis.  No acute bony abnormality.   Original Report Authenticated By: Richarda Overlie, M.D.   Dg Chest Port 1 View  10/27/2012   *RADIOLOGY REPORT*  Clinical Data: Preoperative chest x-ray  PORTABLE CHEST - 1 VIEW  Comparison: None.  Findings: Mild cardiomegaly.  Hiatal hernia.  Negative for pulmonary edema, focal consolidation, pleural effusion or pneumothorax.  No suspicious pulmonary nodule.  Mild osteopenia. Degenerative changes in the right acromioclavicular joint.  No acute osseous abnormality.  IMPRESSION:  1.  No acute cardiopulmonary disease. 2.  Mild cardiomegaly 3.  Hiatal hernia   Original Report Authenticated By: Malachy Moan, M.D.   Dg Knee Complete 4 Views Right  10/27/2012   *RADIOLOGY REPORT*   Clinical Data: Fall and right knee pain.  RIGHT KNEE - COMPLETE 4+ VIEW  Comparison: None.  Findings: There is a markedly displaced fracture through the mid aspect of the patella. There is extensive anterior soft tissue swelling and there is a suprapatellar joint effusion.  The knee is located.  Mild degenerative changes in the knee.  IMPRESSION: Displaced patellar fracture.   Original Report Authenticated By: Richarda Overlie, M.D.    DISCHARGE INSTRUCTIONS:   DISCHARGE MEDICATIONS:     Medication List    ASK your doctor about these medications       levothyroxine 25 MCG tablet  Commonly known as:  SYNTHROID, LEVOTHROID  Take 25 mcg by mouth  daily before breakfast.     LORazepam 0.5 MG tablet  Commonly known as:  ATIVAN  Take 0.5 mg by mouth every 8 (eight) hours as needed for anxiety.        FOLLOW UP VISIT:       Follow-up Information   Follow up with CAFFREY JR,W D, MD. Schedule an appointment as soon as possible for a visit in 2 weeks.   Contact information:   [address] [place] [address] [phone]       DISPOSITION:  Skilled Nursing Facility/Rehab    CONDITION:  Stable   Margart Sickles 10/29/2012, 8:55 AM

## 2012-10-29 NOTE — Care Management Note (Signed)
    Page 1 of 1   10/29/2012     3:02:50 PM   CARE MANAGEMENT NOTE 10/29/2012  Patient:  Kimberly Schneider, Kimberly Schneider   Account Number:  [id number]  Date Initiated:  10/28/2012  Documentation initiated by:  [facility]  Subjective/Objective Assessment:   admitted with closed fracture of rt patella, had ORIF rt patella  lives alone     Action/Plan:   Pt/Ot evals- recommended SNF   Anticipated DC Date:  10/30/2012   Anticipated DC Plan:  SKILLED NURSING FACILITY  In-house referral  Clinical Social Worker      DC Planning Services  CM consult      Choice offered to / List presented to:             Status of service:  Completed, signed off Medicare Important Message given?   (If response is "NO", the following Medicare IM given date fields will be blank) Date Medicare IM given:   Date Additional Medicare IM given:    Discharge Disposition:  SKILLED NURSING FACILITY  Per UR Regulation:    If discussed at Long Length of Stay Meetings, dates discussed:    Comments:

## 2012-10-30 LAB — PROTIME-INR: INR: 1

## 2012-10-30 NOTE — Progress Notes (Signed)
Clinical social worker assisted with patient discharge to skilled nursing facility, [facility] at the [place] [facility] [place] .  CSW addressed all family questions and concerns. CSW copied chart and added all important documents. CSW also set up patient transportation with [employer]. Clinical Social Worker will sign off for now as social work intervention is no longer needed.    Sabino Niemann, MSW [phone]

## 2012-10-30 NOTE — Progress Notes (Signed)
Clinical Social Work Department  BRIEF PSYCHOSOCIAL ASSESSMENT  Patient: Kimberly Schneider Account Number: [id number]   Admit date: 10/27/12  Clinical Social Worker Sabino Niemann, MSW Date/Time: 10/29/12 Referred by: Physician Date Referred: 10/29/12 Referred for   SNF Placement   Other Referral:  Interview type: Patient  Other interview type: PSYCHOSOCIAL DATA  Living Status: Alone Admitted from facility:  Level of care:  Primary support name: Kimberly Schneider Primary support relationship to patient: sister Degree of support available:  Fair  CURRENT CONCERNS  Current Concerns   Post-Acute Placement   Other Concerns:  SOCIAL WORK ASSESSMENT / PLAN  CSW met with pt re: PT recommendation for SNF.   Pt lives alone and is afraid it is not safe to go home  CSW explained placement process and answered questions.   Pt reports Administrator at [facility] [place]    as her preference    CSW completed FL2 and initiated SNF search.     Assessment/plan status: Information/Referral to [facility]  Other assessment/ plan:  Information/referral to community resources:  SNF     PATIENT'S/FAMILY'S RESPONSE TO PLAN OF CARE:  Pt  reports she is agreeable to ST SNF in order to increase strength and independence with mobility prior to returning home  Pt verbalized understanding of placement process and appreciation for CSW assist.   Sabino Niemann, MSW [phone]

## 2012-10-30 NOTE — Clinical Social Work Placement (Signed)
Clinical Social Work Department  CLINICAL SOCIAL WORK PLACEMENT NOTE  10/30/2012 Patient: Kimberly Schneider  Account Number:  [id number] Admit date: 10/27/12 Clinical Social Worker: Sabino Niemann MSW Date/time: 10/29/12 11:30 AM  Clinical Social Work is seeking post-discharge placement for this patient at the following level of care: SKILLED NURSING (*CSW will update this form in Epic as items are completed)  07/08/2014Patient/family provided with [facility] Department of Clinical Social Work's list of facilities offering this level of care within the geographic area requested by the patient (or if unable, by the patient's family).  10/29/2012 Patient/family informed of their freedom to choose among providers that offer the needed level of care, that participate in Medicare, Medicaid or managed care program needed by the patient, have an available bed and are willing to accept the patient.  10/29/2012 Patient/family informed of [facility]' ownership interest in [facility], as well as of the fact that they are under no obligation to receive care at this facility.  PASARR submitted to EDS on Pre-existing  PASARR number received from EDS on  FL2 transmitted to all facilities in geographic area requested by pt/family on 10/29/2012 FL2 transmitted to all facilities within larger geographic area on  Patient informed that his/her managed care company has contracts with or will negotiate with certain facilities, including the following:  Patient/family informed of bed offers received: 10/29/2012 Patient chooses bed at [facility] the [facility]   Physician recommends and patient chooses bed at  Patient to be transferred to on 10/29/12 Patient to be transferred to facility by [facility] The following physician request were entered in Epic:  Additional Comments:

## 2012-11-09 NOTE — Op Note (Signed)
NAMESHANEE, BATCH NO.:  [id number]  MEDICAL RECORD NO.:  [id number]  LOCATION:  5N10C                        FACILITY:  [facility]  PHYSICIAN:  Dyke Brackett, M.D.    DATE OF BIRTH:  17-[name]-1922  DATE OF PROCEDURE:  10/27/2012 DATE OF DISCHARGE:  10/29/2012                              OPERATIVE REPORT   PREOPERATIVE DIAGNOSIS:  Right patella fracture, transverse.  POSTOPERATIVE DIAGNOSIS:  Right patella fracture, transverse.  OPERATION:  Fixation with K-wires and tension band.  SURGEON:  Dyke Brackett, M.D.  ASSISTANT:  Margart Sickles, PA-C  TOURNIQUET TIME:  50 minutes.  TOURNIQUET TIME:  General anesthetic, I believe transverse skin incision.  PROCEDURE IN DETAIL:  We identified a largely separated transverse fracture of the patella, placed 2 provisional K-wires to reduce the fracture.  We then placed a tension band around the patella and reduced this nicely under some tension x-ray.  Fluoroscopy confirmed good reduction in AP lateral plane.  Pins were bent to allow capture of the wire and to make pin less prominent.  The bent end of the cerclage wires was also turned and twisted and buried in the bone and soft tissue.  No prominence was noted.  Retinaculum was extensively torn, was repaired with #1 Vicryl.  The subcutaneous tissues with 2-0 Vicryl and skin with skin clips.  Follow up plan for the patient will be immobilized in a knee immobilizer with no range of motion noted due to poor bone quality and advanced age. Follow up plan in relation to the hospital for 1-2 days.  Possible skilled nursing care with follow up within approximately 2 weeks for suture removal or staple removal in the office.     Dyke Brackett, M.D.     WDC/MEDQ  D:  11/08/2012  T:  11/08/2012  Job:  [id number]

## 2012-11-22 ENCOUNTER — Encounter: Payer: Self-pay | Admitting: Internal Medicine

## 2015-04-23 ENCOUNTER — Ambulatory Visit
Admission: RE | Admit: 2015-04-23 | Discharge: 2015-04-23 | Disposition: A | Discharge status: Home / Self Care | Payer: Medicare Other | Source: Ambulatory Visit | Attending: Internal Medicine | Admitting: Internal Medicine

## 2015-04-23 ENCOUNTER — Other Ambulatory Visit: Payer: Self-pay | Admitting: Internal Medicine

## 2015-04-23 DIAGNOSIS — R1310 Dysphagia, unspecified: Secondary | ICD-10-CM | POA: Diagnosis not present

## 2015-04-23 DIAGNOSIS — K219 Gastro-esophageal reflux disease without esophagitis: Secondary | ICD-10-CM | POA: Insufficient documentation

## 2015-04-23 DIAGNOSIS — K449 Diaphragmatic hernia without obstruction or gangrene: Secondary | ICD-10-CM | POA: Diagnosis not present

## 2017-04-26 ENCOUNTER — Other Ambulatory Visit
Admission: RE | Admit: 2017-04-26 | Discharge: 2017-04-26 | Disposition: A | Discharge status: Home / Self Care | Payer: Medicare Other | Source: Ambulatory Visit | Attending: Internal Medicine | Admitting: Internal Medicine

## 2017-04-26 DIAGNOSIS — R600 Localized edema: Secondary | ICD-10-CM | POA: Diagnosis present

## 2017-04-26 LAB — FIBRIN DERIVATIVES D-DIMER (ARMC ONLY): FIBRIN DERIVATIVES D-DIMER (ARMC): 1607.04 ng{FEU}/mL — AB (ref 0.00–499.00)

## 2017-04-27 ENCOUNTER — Ambulatory Visit
Admission: RE | Admit: 2017-04-27 | Discharge: 2017-04-27 | Disposition: A | Discharge status: Home / Self Care | Payer: Medicare Other | Source: Ambulatory Visit | Attending: Internal Medicine | Admitting: Internal Medicine

## 2017-04-27 ENCOUNTER — Other Ambulatory Visit: Payer: Self-pay | Admitting: Internal Medicine

## 2017-04-27 DIAGNOSIS — I82401 Acute embolism and thrombosis of unspecified deep veins of right lower extremity: Secondary | ICD-10-CM | POA: Insufficient documentation

## 2017-04-27 DIAGNOSIS — I82431 Acute embolism and thrombosis of right popliteal vein: Secondary | ICD-10-CM | POA: Insufficient documentation

## 2017-04-27 DIAGNOSIS — M7989 Other specified soft tissue disorders: Secondary | ICD-10-CM | POA: Diagnosis present

## 2017-04-27 DIAGNOSIS — I82441 Acute embolism and thrombosis of right tibial vein: Secondary | ICD-10-CM | POA: Insufficient documentation

## 2017-04-27 DIAGNOSIS — R7989 Other specified abnormal findings of blood chemistry: Secondary | ICD-10-CM

## 2017-04-27 DIAGNOSIS — R609 Edema, unspecified: Secondary | ICD-10-CM

## 2017-08-15 ENCOUNTER — Inpatient Hospital Stay
Admission: EM | Admit: 2017-08-15 | Discharge: 2017-08-18 | DRG: 377 | Disposition: A | Discharge status: Home Health | Payer: Medicare Other | Attending: Internal Medicine | Admitting: Internal Medicine

## 2017-08-15 ENCOUNTER — Emergency Department: Payer: Medicare Other

## 2017-08-15 ENCOUNTER — Encounter: Payer: Self-pay | Admitting: Emergency Medicine

## 2017-08-15 ENCOUNTER — Other Ambulatory Visit: Payer: Self-pay

## 2017-08-15 DIAGNOSIS — E039 Hypothyroidism, unspecified: Secondary | ICD-10-CM | POA: Diagnosis present

## 2017-08-15 DIAGNOSIS — Z86718 Personal history of other venous thrombosis and embolism: Secondary | ICD-10-CM | POA: Diagnosis not present

## 2017-08-15 DIAGNOSIS — Z79899 Other long term (current) drug therapy: Secondary | ICD-10-CM | POA: Diagnosis not present

## 2017-08-15 DIAGNOSIS — K625 Hemorrhage of anus and rectum: Secondary | ICD-10-CM

## 2017-08-15 DIAGNOSIS — R451 Restlessness and agitation: Secondary | ICD-10-CM | POA: Diagnosis not present

## 2017-08-15 DIAGNOSIS — E860 Dehydration: Secondary | ICD-10-CM | POA: Diagnosis present

## 2017-08-15 DIAGNOSIS — D500 Iron deficiency anemia secondary to blood loss (chronic): Secondary | ICD-10-CM | POA: Diagnosis present

## 2017-08-15 DIAGNOSIS — Z66 Do not resuscitate: Secondary | ICD-10-CM | POA: Diagnosis present

## 2017-08-15 DIAGNOSIS — F0390 Unspecified dementia without behavioral disturbance: Secondary | ICD-10-CM | POA: Diagnosis present

## 2017-08-15 DIAGNOSIS — D649 Anemia, unspecified: Secondary | ICD-10-CM

## 2017-08-15 DIAGNOSIS — Z7901 Long term (current) use of anticoagulants: Secondary | ICD-10-CM | POA: Diagnosis not present

## 2017-08-15 DIAGNOSIS — J9601 Acute respiratory failure with hypoxia: Secondary | ICD-10-CM | POA: Diagnosis not present

## 2017-08-15 DIAGNOSIS — K922 Gastrointestinal hemorrhage, unspecified: Principal | ICD-10-CM | POA: Diagnosis present

## 2017-08-15 DIAGNOSIS — Z515 Encounter for palliative care: Secondary | ICD-10-CM | POA: Diagnosis not present

## 2017-08-15 DIAGNOSIS — Z7189 Other specified counseling: Secondary | ICD-10-CM | POA: Diagnosis not present

## 2017-08-15 DIAGNOSIS — R609 Edema, unspecified: Secondary | ICD-10-CM

## 2017-08-15 HISTORY — DX: Personal history of other venous thrombosis and embolism: Z86.718

## 2017-08-15 LAB — COMPREHENSIVE METABOLIC PANEL
ALK PHOS: 69 U/L (ref 38–126)
ALT: 24 U/L (ref 14–54)
AST: 41 U/L (ref 15–41)
Albumin: 3.3 g/dL — ABNORMAL LOW (ref 3.5–5.0)
Anion gap: 6 (ref 5–15)
BUN: 42 mg/dL — AB (ref 6–20)
CALCIUM: 8.5 mg/dL — AB (ref 8.9–10.3)
CO2: 22 mmol/L (ref 22–32)
CREATININE: 1.08 mg/dL — AB (ref 0.44–1.00)
Chloride: 118 mmol/L — ABNORMAL HIGH (ref 101–111)
GFR calc Af Amer: 49 mL/min — ABNORMAL LOW (ref >60)
GFR, EST NON AFRICAN AMERICAN: 43 mL/min — AB (ref >60)
Glucose, Bld: 177 mg/dL — ABNORMAL HIGH (ref 65–99)
Potassium: 4.6 mmol/L (ref 3.5–5.1)
Sodium: 146 mmol/L — ABNORMAL HIGH (ref 135–145)
TOTAL PROTEIN: 6.3 g/dL — AB (ref 6.5–8.1)
Total Bilirubin: 0.3 mg/dL (ref 0.3–1.2)

## 2017-08-15 LAB — CBC WITH DIFFERENTIAL/PLATELET
BASOS ABS: 0 10*3/uL (ref 0–0.1)
Basophils Relative: 0 %
EOS ABS: 0 10*3/uL (ref 0–0.7)
EOS PCT: 0 %
HCT: 12.8 % — CL (ref 35.0–47.0)
Hemoglobin: 3.9 g/dL — CL (ref 12.0–16.0)
LYMPHS ABS: 0.7 10*3/uL — AB (ref 1.0–3.6)
Lymphocytes Relative: 9 %
MCH: 19.9 pg — ABNORMAL LOW (ref 26.0–34.0)
MCHC: 30.4 g/dL — ABNORMAL LOW (ref 32.0–36.0)
MCV: 65.6 fL — AB (ref 80.0–100.0)
MONO ABS: 0.4 10*3/uL (ref 0.2–0.9)
Monocytes Relative: 5 %
Neutro Abs: 6.3 10*3/uL (ref 1.4–6.5)
Neutrophils Relative %: 86 %
PLATELETS: 295 10*3/uL (ref 150–440)
RBC: 1.95 MIL/uL — AB (ref 3.80–5.20)
RDW: 21.2 % — AB (ref 11.5–14.5)
WBC: 7.4 10*3/uL (ref 3.6–11.0)

## 2017-08-15 LAB — ABO/RH: ABO/RH(D): A POS

## 2017-08-15 MED ORDER — SODIUM CHLORIDE 0.9 % IV SOLN
INTRAVENOUS | Status: DC
  Administered 2017-08-15 – 2017-08-16 (×2): via INTRAVENOUS

## 2017-08-15 MED ORDER — LORAZEPAM 2 MG/ML IJ SOLN
1.0000 mg | Freq: Once | INTRAMUSCULAR | Status: AC
  Administered 2017-08-15: 1 mg via INTRAVENOUS

## 2017-08-15 MED ORDER — ONDANSETRON HCL 4 MG/2ML IJ SOLN: 4.0000 mg | Freq: Four times a day (QID) | INTRAMUSCULAR | Status: DC | PRN

## 2017-08-15 MED ORDER — LORAZEPAM 2 MG/ML IJ SOLN
0.5000 mg | INTRAMUSCULAR | Status: AC
  Administered 2017-08-15: 0.5 mg via INTRAVENOUS

## 2017-08-15 MED ORDER — FAMOTIDINE IN NACL 20-0.9 MG/50ML-% IV SOLN: 20.0000 mg | Freq: Two times a day (BID) | INTRAVENOUS | Status: DC

## 2017-08-15 MED ORDER — ACETAMINOPHEN 650 MG RE SUPP: 650.0000 mg | Freq: Four times a day (QID) | RECTAL | Status: DC | PRN

## 2017-08-15 MED ORDER — LEVOTHYROXINE SODIUM 50 MCG PO TABS
25.0000 ug | ORAL_TABLET | Freq: Every day | ORAL | Status: DC
  Administered 2017-08-17 – 2017-08-18 (×2): 25 ug via ORAL
  Filled 2017-08-15 (×2): qty 1

## 2017-08-15 MED ORDER — SODIUM CHLORIDE 0.9 % IV SOLN
10.0000 mL/h | Freq: Once | INTRAVENOUS | Status: AC
  Administered 2017-08-15: 10 mL/h via INTRAVENOUS

## 2017-08-15 MED ORDER — ONDANSETRON HCL 4 MG PO TABS: 4.0000 mg | ORAL_TABLET | Freq: Four times a day (QID) | ORAL | Status: DC | PRN

## 2017-08-15 MED ORDER — LORAZEPAM 2 MG/ML IJ SOLN
INTRAMUSCULAR | Status: AC
  Administered 2017-08-15: 1 mg via INTRAVENOUS
  Filled 2017-08-15: qty 1

## 2017-08-15 MED ORDER — FAMOTIDINE IN NACL 20-0.9 MG/50ML-% IV SOLN
20.0000 mg | Freq: Every day | INTRAVENOUS | Status: DC
  Administered 2017-08-15 – 2017-08-16 (×2): 20 mg via INTRAVENOUS
  Filled 2017-08-15 (×2): qty 50

## 2017-08-15 MED ORDER — LORAZEPAM 2 MG/ML IJ SOLN
INTRAMUSCULAR | Status: AC
  Administered 2017-08-15: 0.5 mg via INTRAVENOUS
  Filled 2017-08-15: qty 1

## 2017-08-15 MED ORDER — ACETAMINOPHEN 325 MG PO TABS: 650.0000 mg | ORAL_TABLET | Freq: Four times a day (QID) | ORAL | Status: DC | PRN

## 2017-08-15 NOTE — ED Provider Notes (Signed)
[facility] Emergency Department Provider Note   ____________________________________________   First MD Initiated Contact with Patient 08/15/17 1655     (approximate)  I have reviewed the triage vital signs and the nursing notes.   HISTORY  Chief Complaint Sent by Dr (low hgb, poss dehydration)  Patient is not answering questions very much.  I have to yell out her in a very loud voice to get her to follow any commands.  I am not sure if she is just altered because of her low hemoglobin or if she is partly deaf or the dementia is acting up but what ever the cause it is very difficult to get a history  HPI Kimberly Schneider is a 84 y.o. female she went to the doctor today for fatigue and altered mental status.  She is becoming less active and more lethargic.  She had lab work done and it was noted to have a hemoglobin of 3.9 and was sent here.  She takes Xarelto for having had DVTs in her right leg in January.  He is already on iron and folic acid supplements   Past Medical History:  Diagnosis Date  . History of blood clots   . Hypothyroidism     Patient Active Problem List   Diagnosis Date Noted  . Closed fracture of right patella 10/27/2012  . Hypothyroidism 10/27/2012    Past Surgical History:  Procedure Laterality Date  . ORIF PATELLA Right 10/27/2012   Procedure: OPEN REDUCTION INTERNAL (ORIF) FIXATION PATELLA;  Surgeon: Thera Flake., MD;  Location: [facility] OR;  Service: Orthopedics;  Laterality: Right;    Prior to Admission medications   Medication Sig Start Date End Date Taking? Authorizing Provider  acetaminophen (TYLENOL) 325 MG tablet Take 2 tablets (650 mg total) by mouth every 6 (six) hours as needed for pain. 10/29/12   Chadwell, Ivin Booty, PA-C  docusate sodium 100 MG CAPS Take 100 mg by mouth 2 (two) times daily. 10/29/12   Chadwell, Ivin Booty, PA-C  enoxaparin (LOVENOX) 40 MG/0.4ML injection Inject 0.4 mLs (40 mg total) into the skin daily. 10/29/12    Chadwell, Ivin Booty, PA-C  HYDROcodone-acetaminophen (NORCO/VICODIN) 5-325 MG per tablet Take 1-2 tablets by mouth every 6 (six) hours as needed. 10/29/12   Chadwell, Ivin Booty, PA-C  levothyroxine (SYNTHROID, LEVOTHROID) 25 MCG tablet Take 1 tablet (25 mcg total) by mouth daily before breakfast. 10/29/12   Chadwell, Ivin Booty, PA-C    Allergies Patient has no known allergies.  Family History  Problem Relation Age of Onset  . Diabetes Mother   . Heart failure Father   . Heart failure Brother     Social History Social History   Tobacco Use  . Smoking status: Never Smoker  . Smokeless tobacco: Never Used  Substance Use Topics  . Alcohol use: Never    Frequency: Never  . Drug use: Not on file    Review of Systems  Unable to obtain ____________________________________________   PHYSICAL EXAM:  VITAL SIGNS: ED Triage Vitals  Enc Vitals Group     BP 08/15/17 1643 (!) 109/38     Pulse --      Resp 08/15/17 1643 16     Temp 08/15/17 1649 98 F (36.7 C)     Temp Source 08/15/17 1649 Axillary     SpO2 08/15/17 1643 100 %     Weight 08/15/17 1649 110 lb (49.9 kg)     Height 08/15/17 1649 5\' 1"  (1.549 m)  Head Circumference --      Peak Flow --      Pain Score --      Pain Loc --      Pain Edu? --      Excl. in GC? --     Constitutional: Alert very pale Eyes: Conjunctivae are pale  Head: Atraumatic. Nose: No congestion/rhinnorhea. Mouth/Throat: Mucous membranes are moist.  Oropharynx non-erythematous. Neck: No stridor.   Cardiovascular: Normal rate, regular rhythm. Grossly normal heart sounds.  Good peripheral circulation. Respiratory: Normal respiratory effort.  No retractions. Lungs CTAB. Gastrointestinal: Soft and nontender. No distention. No abdominal bruits. No CVA tenderness. Rectal: No hemorrhoids or tears are seen and no masses are palpated stool is brown but on Hemoccult testing I obtained a very dark blue color Musculoskeletal: No lower extremity tenderness nor  edema.   Neurologic: Patient speaking very little difficult to have her follow commands no gross focal neurologic deficits are appreciated.  Skin:  Skin is warm, dry and intact.  But very pale Psychiatric: Mood and affect are normal. Speech and behavior are normal.  ____________________________________________   LABS (all labs ordered are listed, but only abnormal results are displayed)  Labs Reviewed  COMPREHENSIVE METABOLIC PANEL  CBC WITH DIFFERENTIAL/PLATELET  URINALYSIS, COMPLETE (UACMP) WITH MICROSCOPIC  PREPARE RBC (CROSSMATCH)   ____________________________________________  EKG   ____________________________________________  RADIOLOGY  ED MD interpretation:    Official radiology report(s): No results found.  ____________________________________________   PROCEDURES  Procedure(s) performed:   Procedures  Critical Ca  ____________________________________________   INITIAL IMPRESSION / ASSESSMENT AND PLAN / ED COURSE  Discussed trans-refusion risks and benefits with patient and family.  Family agrees to transfuse.  Patient is at high risk due to being on Xarelto         ____________________________________________   FINAL CLINICAL IMPRESSION(S) / ED DIAGNOSES  Final diagnoses:  Symptomatic anemia  Rectal bleed     ED Discharge Orders    None       Note:  This document was prepared using Dragon voice recognition software and may include unintentional dictation errors.    Arnaldo NatalMalinda, Jaspreet Hollings F, MD 08/15/17 [phone]

## 2017-08-15 NOTE — H&P (Signed)
[facility] [place] [facility]   PATIENT NAME: Kimberly Schneider    MR#:  [id number]  DATE OF BIRTH:  11/02/1921  DATE OF ADMISSION:  08/15/2017  PRIMARY CARE PHYSICIAN: Lauro RegulusAnderson, Marshall W, MD   REQUESTING/REFERRING PHYSICIAN:   CHIEF COMPLAINT:   Chief Complaint  Patient presents with  . Sent by Dr    low hgb, poss dehydration    HISTORY OF PRESENT ILLNESS: Kimberly MainlandFrances Dorer  is a 84 y.o. female with a known history of hypothyroidism was referred from primary care physician office for low hemoglobin.  Patient hemoglobin was low as 3.9.  Patient was evaluated in the emergency room.  She is on oral Xarelto at home.  Patient has advanced dementia and has a caregiver at home.  Dependent on activities of daily living and instrumental activities of daily living.  Patient takes oral Xarelto because of history of DVT in the right lower extremity which was diagnosed in January.  Patient also on oral iron and folic acid supplements.  PAST MEDICAL HISTORY:   Past Medical History:  Diagnosis Date  . History of blood clots   . Hypothyroidism   DVT right lower extremity  PAST SURGICAL HISTORY:  Past Surgical History:  Procedure Laterality Date  . ORIF PATELLA Right 10/27/2012   Procedure: OPEN REDUCTION INTERNAL (ORIF) FIXATION PATELLA;  Surgeon: Thera FlakeW D Caffrey Jr., MD;  Location: [facility] OR;  Service: Orthopedics;  Laterality: Right;    SOCIAL HISTORY:  Social History   Tobacco Use  . Smoking status: Never Smoker  . Smokeless tobacco: Never Used  Substance Use Topics  . Alcohol use: Never    Frequency: Never    FAMILY HISTORY:  Family History  Problem Relation Age of Onset  . Diabetes Mother   . Heart failure Father   . Heart failure Brother     DRUG ALLERGIES: No Known Allergies  REVIEW OF SYSTEMS:  Could not be obtained as patient is demented  MEDICATIONS AT HOME:  Prior to Admission medications   Medication Sig Start Date End Date Taking? Authorizing  Provider  levothyroxine (SYNTHROID, LEVOTHROID) 25 MCG tablet Take 1 tablet (25 mcg total) by mouth daily before breakfast. 10/29/12  Yes Chadwell, Ivin BootyJoshua, PA-C  pantoprazole (PROTONIX) 20 MG tablet Take 1 tablet by mouth daily. 07/18/17  Yes [provider]  XARELTO 20 MG TABS tablet Take 1 tablet by mouth daily. 07/26/17  Yes [provider]  acetaminophen (TYLENOL) 325 MG tablet Take 2 tablets (650 mg total) by mouth every 6 (six) hours as needed for pain. 10/29/12   Chadwell, Ivin BootyJoshua, PA-C  docusate sodium 100 MG CAPS Take 100 mg by mouth 2 (two) times daily. 10/29/12   Chadwell, Ivin BootyJoshua, PA-C  enoxaparin (LOVENOX) 40 MG/0.4ML injection Inject 0.4 mLs (40 mg total) into the skin daily. Patient not taking: Reported on 08/15/2017 10/29/12   Margart Sickleshadwell, Joshua, PA-C  HYDROcodone-acetaminophen (NORCO/VICODIN) 5-325 MG per tablet Take 1-2 tablets by mouth every 6 (six) hours as needed. Patient not taking: Reported on 08/15/2017 10/29/12   Margart Sickleshadwell, Joshua, PA-C      PHYSICAL EXAMINATION:   VITAL SIGNS: Blood pressure (!) 109/38, temperature 98 F (36.7 C), temperature source Axillary, resp. rate 16, height 5\' 1"  (1.549 m), weight 49.9 kg (110 lb), SpO2 100 %.  GENERAL:  84 y.o.-year-old patient lying in the bed  EYES: Pupils equal, round, reactive to light and accommodation. No scleral icterus. Extraocular muscles intact.  HEENT: Head atraumatic, normocephalic. Oropharynx and nasopharynx  clear.  NECK:  Supple, no jugular venous distention. No thyroid enlargement, no tenderness.  LUNGS: Normal breath sounds bilaterally, no wheezing, rales,rhonchi or crepitation. No use of accessory muscles of respiration.  CARDIOVASCULAR: S1, S2 normal. No murmurs, rubs, or gallops.  ABDOMEN: Soft, nontender, nondistended. Bowel sounds present. No organomegaly or mass.  EXTREMITIES: No pedal edema, cyanosis, or clubbing.  NEUROLOGIC: Patient has advanced dementia Moves all extremities Not oriented to time  place and person . Gait not checked.  PSYCHIATRIC: Could not be assessed SKIN: No obvious rash, lesion, or ulcer.   LABORATORY PANEL:   CBC Recent Labs  Lab 08/15/17 1715  WBC PENDING  HGB 3.9*  HCT 12.8*  PLT 295  MCV 65.6*  MCH 19.9*  MCHC 30.4*  RDW 21.2*  LYMPHSABS PENDING  MONOABS PENDING  EOSABS PENDING  BASOSABS PENDING   ------------------------------------------------------------------------------------------------------------------  Chemistries  Recent Labs  Lab 08/15/17 1715  NA 146*  K 4.6  CL 118*  CO2 22  GLUCOSE 177*  BUN 42*  CREATININE 1.08*  CALCIUM 8.5*  AST 41  ALT 24  ALKPHOS 69  BILITOT 0.3   ------------------------------------------------------------------------------------------------------------------ estimated creatinine clearance is 24 mL/min (A) (by C-G formula based on SCr of 1.08 mg/dL (H)). ------------------------------------------------------------------------------------------------------------------ No results for input(s): TSH, T4TOTAL, T3FREE, THYROIDAB in the last 72 hours.  Invalid input(s): FREET3   Coagulation profile No results for input(s): INR, PROTIME in the last 168 hours. ------------------------------------------------------------------------------------------------------------------- No results for input(s): DDIMER in the last 72 hours. -------------------------------------------------------------------------------------------------------------------  Cardiac Enzymes No results for input(s): CKMB, TROPONINI, MYOGLOBIN in the last 168 hours.  Invalid input(s): CK ------------------------------------------------------------------------------------------------------------------ Invalid input(s): POCBNP  ---------------------------------------------------------------------------------------------------------------  Urinalysis No results found for: COLORURINE, APPEARANCEUR, LABSPEC, PHURINE, GLUCOSEU,  HGBUR, BILIRUBINUR, KETONESUR, PROTEINUR, UROBILINOGEN, NITRITE, LEUKOCYTESUR   RADIOLOGY: Dg Chest Portable 1 View  Result Date: 08/15/2017 CLINICAL DATA:  84 y/o  F; fatigue and altered mental status. EXAM: PORTABLE CHEST 1 VIEW COMPARISON:  None. FINDINGS: Mild cardiomegaly. Aortic atherosclerosis with calcification. Reticular opacities of the lungs. No focal consolidation. No pleural effusion or pneumothorax. Mild thoracolumbar spine levocurvature. IMPRESSION: Reticular opacities of the lungs, probably interstitial edema. Mild cardiomegaly. Electronically Signed   By: Mitzi Hansen M.D.   On: 08/15/2017 17:46    EKG: Orders placed or performed during the hospital encounter of 08/15/17  . ED EKG  . ED EKG    IMPRESSION AND PLAN:  84 year old elderly female patient with history of hypothyroidism, DVT in the leg, dementia was referred from primary care physician office for low hemoglobin.  Symptomatic anemia Transfused 3 units PRBC IV  Gastrointestinal bleeding Monitor hemoglobin and hematocrit closely IV Pepcid 20 mg every 12 hourly  gastroenterology consultation  Dehydration IV fluids  History of DVT in the lower extremity Hold anticoagulation for now in view of GI bleed  Advanced dementia Supportive care  Patient's family does not want any aggressive measures such as colonoscopy and endoscopy and work-up Will transfuse PRBC and supportive care   All the records are reviewed and case discussed with ED provider. Management plans discussed with the patient, family and they are in agreement.  CODE STATUS:DNR    Code Status Orders  (From admission, onward)        Start     Ordered   08/15/17 1815  Do not attempt resuscitation (DNR)  Continuous    Question Answer Comment  In the event of cardiac or respiratory ARREST Do not call a "code blue"   In the event of cardiac  or respiratory ARREST Do not perform Intubation, CPR, defibrillation or ACLS   In the  event of cardiac or respiratory ARREST Use medication by any route, position, wound care, and other measures to relive pain and suffering. May use oxygen, suction and manual treatment of airway obstruction as needed for comfort.      08/15/17 1814    Code Status History    This patient has a current code status but no historical code status.       TOTAL TIME TAKING CARE OF THIS PATIENT: 51 minutes.    Ihor Austin M.D on 08/15/2017 at 6:16 PM  Between 7am to 6pm - Pager - [phone]  After 6pm go to [email or web address] - password [email or web address] [facility]  [facility]  Office  [phone]  CC: Primary care physician; Lauro Regulus, MD

## 2017-08-15 NOTE — ED Triage Notes (Addendum)
FIRST NURSE NOTE-here for low hgb from doctor. In care everywhere hgb 3.9.  On xarelto.  No bleeding per family.  Alert but pale.

## 2017-08-15 NOTE — Progress Notes (Signed)
Advanced care plan.  Purpose of the Encounter: CODE STATUS  Parties in Attendance:Patient and family Patient's Decision Capacity:Demented Subjective/Patient's story: Presented to the emergency room with low hemoglobin Objective/Medical story Has gastrointestinal bleeding Goals of care determination:  Advanced directives discussed Family does not want any cardiac resuscitation, intubation and ventilator of the need arises Goals met CODE STATUS: DNR Time spent discussing advanced care planning: 16 minutes

## 2017-08-16 ENCOUNTER — Inpatient Hospital Stay: Payer: Medicare Other

## 2017-08-16 DIAGNOSIS — D649 Anemia, unspecified: Secondary | ICD-10-CM

## 2017-08-16 LAB — URINALYSIS, COMPLETE (UACMP) WITH MICROSCOPIC
BILIRUBIN URINE: NEGATIVE
Glucose, UA: NEGATIVE mg/dL
KETONES UR: NEGATIVE mg/dL
LEUKOCYTES UA: NEGATIVE
Nitrite: NEGATIVE
PH: 5 (ref 5.0–8.0)
Protein, ur: NEGATIVE mg/dL
Specific Gravity, Urine: 1.009 (ref 1.005–1.030)
Squamous Epithelial / LPF: NONE SEEN (ref 0–5)

## 2017-08-16 LAB — BASIC METABOLIC PANEL
ANION GAP: 7 (ref 5–15)
BUN: 39 mg/dL — ABNORMAL HIGH (ref 6–20)
CO2: 21 mmol/L — AB (ref 22–32)
Calcium: 8 mg/dL — ABNORMAL LOW (ref 8.9–10.3)
Chloride: 119 mmol/L — ABNORMAL HIGH (ref 101–111)
Creatinine, Ser: 1.22 mg/dL — ABNORMAL HIGH (ref 0.44–1.00)
GFR calc non Af Amer: 37 mL/min — ABNORMAL LOW (ref >60)
GFR, EST AFRICAN AMERICAN: 43 mL/min — AB (ref >60)
GLUCOSE: 158 mg/dL — AB (ref 65–99)
Potassium: 4.1 mmol/L (ref 3.5–5.1)
Sodium: 147 mmol/L — ABNORMAL HIGH (ref 135–145)

## 2017-08-16 LAB — HEMOGLOBIN AND HEMATOCRIT, BLOOD
HCT: 23.7 % — ABNORMAL LOW (ref 35.0–47.0)
HEMATOCRIT: 24.8 % — AB (ref 35.0–47.0)
HEMATOCRIT: 27.1 % — AB (ref 35.0–47.0)
HEMOGLOBIN: 7.5 g/dL — AB (ref 12.0–16.0)
HEMOGLOBIN: 8.7 g/dL — AB (ref 12.0–16.0)
Hemoglobin: 8 g/dL — ABNORMAL LOW (ref 12.0–16.0)

## 2017-08-16 MED ORDER — FUROSEMIDE 10 MG/ML IJ SOLN
20.0000 mg | Freq: Once | INTRAMUSCULAR | Status: AC
  Administered 2017-08-16: 20 mg via INTRAVENOUS
  Filled 2017-08-16: qty 4

## 2017-08-16 MED ORDER — FUROSEMIDE 10 MG/ML IJ SOLN
20.0000 mg | Freq: Two times a day (BID) | INTRAMUSCULAR | Status: DC
  Administered 2017-08-16 – 2017-08-17 (×2): 20 mg via INTRAVENOUS
  Filled 2017-08-16 (×2): qty 4

## 2017-08-16 MED ORDER — IPRATROPIUM-ALBUTEROL 0.5-2.5 (3) MG/3ML IN SOLN
3.0000 mL | RESPIRATORY_TRACT | Status: DC
  Administered 2017-08-16 – 2017-08-18 (×10): 3 mL via RESPIRATORY_TRACT
  Filled 2017-08-16 (×11): qty 3

## 2017-08-16 MED ORDER — PANTOPRAZOLE SODIUM 40 MG IV SOLR
40.0000 mg | Freq: Two times a day (BID) | INTRAVENOUS | Status: DC
  Administered 2017-08-16 – 2017-08-17 (×3): 40 mg via INTRAVENOUS
  Filled 2017-08-16 (×3): qty 40

## 2017-08-16 MED ORDER — ALPRAZOLAM 0.25 MG PO TABS
0.2500 mg | ORAL_TABLET | Freq: Two times a day (BID) | ORAL | Status: DC | PRN
Start: 2017-08-16 — End: 2017-08-17

## 2017-08-16 NOTE — Progress Notes (Addendum)
Upon review of patient's chart, RN informed Dr. Elisabeth PigeonVachhani 2/3 units were given last night.

## 2017-08-16 NOTE — Clinical Social Work Note (Signed)
CSW received phone call from patient's niece [name] Broach, she is requesting SNF placement for patient.  CSW explained to her that in order to go to SNF for short term rehab, PT has to see patient and also insurance has to approve her for SNF.  CSW explained to her, that if patient is not approved for SNF placement, the only other option is to pay privately for in home care or to pay privately for SNF.  Patient's niece said that patient can not afford to pay privately for SNF, CSW explained to her that if insurance company denies patient and they still would like SNF the facility will have work with her to apply for Medicaid.  CSW also informed patient's niece that it can take up to 45 days to get approval for Medicaid.  Patient's niece would like CSW to [place] pending PT recommendations and insurance approval.  Ervin KnackEric R. Jernee Murtaugh, MSW, Theresia MajorsLCSWA [phone]  08/16/2017 8:12 PM

## 2017-08-16 NOTE — Progress Notes (Signed)
[facility] - [place] at [facility]   PATIENT NAME: Kimberly Schneider    MR#:  [id number]  DATE OF BIRTH:  03/02/1922  SUBJECTIVE:  CHIEF COMPLAINT:   Chief Complaint  Patient presents with  . Sent by Dr    low hgb, poss dehydration   Came with progressive weakness, noted to have very low hemoglobin, patient is confused at baseline dementia. Hemoglobin responded to blood transfusion, has some respiratory distress today.  REVIEW OF SYSTEMS:   Patient is agitated with dementia and cannot give review of system.  ROS  DRUG ALLERGIES:  No Known Allergies  VITALS:  Blood pressure (!) 142/60, pulse 81, temperature 99.3 F (37.4 C), temperature source Axillary, resp. rate (!) 32, height 5\' 1"  (1.549 m), weight 49.9 kg (110 lb), SpO2 99 %.  PHYSICAL EXAMINATION:  GENERAL:  84 y.o.-year-old thin patient lying in the bed with no acute distress.  EYES: Pupils equal, round, reactive to light and accommodation. No scleral icterus. Extraocular muscles intact. Conjunctiva pale. HEENT: Head atraumatic, normocephalic. Oropharynx and nasopharynx clear.  NECK:  Supple, no jugular venous distention. No thyroid enlargement, no tenderness.  LUNGS: Normal breath sounds bilaterally, no wheezing, rales,rhonchi or crepitation. No use of accessory muscles of respiration.  CARDIOVASCULAR: S1, S2 normal. No murmurs, rubs, or gallops.  ABDOMEN: Soft, nontender, nondistended. Bowel sounds present. No organomegaly or mass.  EXTREMITIES: No pedal edema, cyanosis, or clubbing. mittens in place as patient is agitated NEUROLOGIC: Cranial nerves II through XII are intact. Muscle strength 4/5 in all extremities. Sensation intact. Gait not checked.  PSYCHIATRIC: The patient is alert and agitated, disoriented.  SKIN: No obvious rash, lesion, or ulcer.   Physical Exam LABORATORY PANEL:   CBC Recent Labs  Lab 08/15/17 1715  08/16/17 1524  WBC 7.4  --   --   HGB 3.9*   < > 8.7*  HCT 12.8*   < >  27.1*  PLT 295  --   --    < > = values in this interval not displayed.   ------------------------------------------------------------------------------------------------------------------  Chemistries  Recent Labs  Lab 08/15/17 1715 08/16/17 0419  NA 146* 147*  K 4.6 4.1  CL 118* 119*  CO2 22 21*  GLUCOSE 177* 158*  BUN 42* 39*  CREATININE 1.08* 1.22*  CALCIUM 8.5* 8.0*  AST 41  --   ALT 24  --   ALKPHOS 69  --   BILITOT 0.3  --    ------------------------------------------------------------------------------------------------------------------  Cardiac Enzymes No results for input(s): TROPONINI in the last 168 hours. ------------------------------------------------------------------------------------------------------------------  RADIOLOGY:  Dg Chest Portable 1 View  Result Date: 08/15/2017 CLINICAL DATA:  84 y/o  F; fatigue and altered mental status. EXAM: PORTABLE CHEST 1 VIEW COMPARISON:  None. FINDINGS: Mild cardiomegaly. Aortic atherosclerosis with calcification. Reticular opacities of the lungs. No focal consolidation. No pleural effusion or pneumothorax. Mild thoracolumbar spine levocurvature. IMPRESSION: Reticular opacities of the lungs, probably interstitial edema. Mild cardiomegaly. Electronically Signed   By: Mitzi HansenLance  Furusawa-Stratton M.D.   On: 08/15/2017 17:46    ASSESSMENT AND PLAN:   Active Problems:   GI bleed  84 year old elderly female patient with history of hypothyroidism, DVT in the leg, dementia was referred from primary care physician office for low hemoglobin.  * Symptomatic anemia Transfused 2 units PRBC IV  Hemoglobin came up, continue monitoring now and transfuse if needed.  * Gastrointestinal bleeding Monitor hemoglobin and hematocrit closely IV protonix every 12 hourly  gastroenterology consultation appreciated, due to old age, and  family preference- no procedures suggested at this time.  I will start on clear liquid diet and  upgraded as she can tolerate.  * Acute respiratory failure with hypoxia   Patient had respiratory distress after receiving 2 units of transfusion and continues IV fluids, given IV Lasix, follow chest x-ray and stop IV fluids.  * Dehydration IV fluids was given since admission, stop now due to respiratory distress and pulmonary edema.   * History of DVT in the lower extremity Hold anticoagulation for now in view of GI bleed  * Advanced dementia Supportive care   All the records are reviewed and case discussed with Care Management/Social Workerr. Management plans discussed with the patient, family and they are in agreement.  CODE STATUS: DNR  TOTAL TIME TAKING CARE OF THIS PATIENT: 40 minutes.  I discussed with patient's healthcare power of attorney, they agree with the plan of monitoring and no procedures at this time. I had also encouraged him to meet with palliative care as patient has dementia and she has progressively decreasing oral intake, to involve hospice services at the time of discharge. Family is considering patient is not a safe discharge at home, they are looking into the option of assisted living facilities with help of social worker.   POSSIBLE D/C IN 1-2 DAYS, DEPENDING ON CLINICAL CONDITION.   Altamese Dilling M.D on 08/16/2017   Between 7am to 6pm - Pager - [phone]  After 6pm go to [email or web address] - Social research officer, government  [facility] [place] [facility]  Office  [phone]  CC: Primary care physician; Lauro Regulus, MD  Note: This dictation was prepared with Dragon dictation along with smaller phrase technology. Any transcriptional errors that result from this process are unintentional.

## 2017-08-16 NOTE — Progress Notes (Signed)
RN check pt.'s VS prior to last RBC unit. O2 on room air was 67%, O2 was tried on multiple fingers, forehead, and finally got a reading on her earlobe which was 67%. 4L was placed on pt and O2 came up to 92-94%. Respiratory therapy was called and came into pt.'s room to assess her. Pt is fidgety while lying in bed, pt is not talking or opening eyes for nursing staff. Lungs sound wet with crackles and wheezing bilaterally. MD was notified. New orders to d/c last ord  unit of RBCs at this time. Hgb was 7.5 at 0419, next check is at 0900. New orders to give 20 mg IV of lasix at this time. Will monitor pt closely.   Vandora Jaskulski [employer]

## 2017-08-16 NOTE — Care Management (Signed)
Patient admitted from home with anemia.  Patient with history of dementia.  History obtained from niece who is POA.  Niece states that patient has caregivers who come to the home, but "the money is running out to pay for them".  Niece is requesting SNF placement.  RNCM explained that patient would have to be seen by PT, skilled need would have to be determined, insurance approval would have to be obtained, bed search initiated, and then family would be presented with offers and select facility.  RNCM explained that if there is not a skilled need for placement, then placement would have to be paid out of pocket.  Niece is understanding but states "she is not going to be able to go home"  Patient has no medical equipment in the home.  Is currently requiring acute O2.  PT consult pending.

## 2017-08-16 NOTE — Clinical Social Work Note (Signed)
Clinical Social Work Assessment  Patient Details  Name: Kimberly Schneider MRN: [id number] Date of Birth: 03/08/1922  Date of referral:  08/16/17               Reason for consult:  Facility Placement                Permission sought to share information with:  Facility Medical sales representativeContact Representative, Family Supports Permission granted to share information::  Yes, Verbal Permission Granted  Name::     Jonnie Kindaul,Marie Sister [phone] or broach,jan Niece   [phone] or broach,ken Relative   [phone]   Agency::  SNF admissions  Relationship::     Contact Information:     Housing/Transportation Living arrangements for the past 2 months:  Single Family Home Source of Information:  Other (Comment Required)(Patient's niece) Patient Interpreter Needed:  None Criminal Activity/Legal Involvement Pertinent to Current Situation/Hospitalization:  No - Comment as needed Significant Relationships:  Other Family Members Lives with:  Self Do you feel safe going back to the place where you live?  No Need for family participation in patient care:  Yes (Comment)  Care giving concerns:  Patient's family is concerned that patient will need short term rehab, and she may not be able to return back home.   Social Worker assessment / plan:  Patient is a 84 year old female who has dementia, and is disoriented x4.  Assessment completed by speaking with patient's niece [name] Broach, due to dementia.  Per patient's niece patient lives alone, and has some dementia, she is not safe to discharge back home.  Patient's niece stated that patient has not been to rehab in a few years, CSW explained to patient's niece what the process is for finding placement and what to expect at SNF.  Patient's niece inquired about what would happen if patient can not afford SNF, CSW explained the only options are to go to SNF under private pay or return back home with home health.  CSW explained that they would have to look at applying for Medicaid  and it would take up to 45 days for approval.  Patient's niece expressed understanding and gave CSW permission to begin bed [place] [facility] once PT has seen and evaluated patient.   Employment status:  Retired Database administratornsurance information:  Managed Medicare PT Recommendations:  Skilled Nursing Facility Information / Referral to community resources:  Skilled Nursing Facility  Patient/Family's Response to care:  Patient's family would like her to go to SNF for short term rehab.  Patient/Family's Understanding of and Emotional Response to Diagnosis, Current Treatment, and Prognosis:  Patient's family is hopeful that she can go to SNF for short term rehab, and then can find an alternative option verse going back home.    Emotional Assessment Appearance:  Appears stated age Attitude/Demeanor/Rapport:    Affect (typically observed):  Appropriate, Stable Orientation:    Alcohol / Substance use:  Not Applicable Psych involvement (Current and /or in the community):  No (Comment)  Discharge Needs  Concerns to be addressed:  Lack of Support, Cognitive Concerns, Care Coordination Readmission within the last 30 days:  No Current discharge risk:  Cognitively Impaired, Lives alone Barriers to Discharge:  Continued Medical Work up, [employer] InternationalFamily Issues, Unsafe home situation, Insurance Authorization   [place] [employer], Haydan Wedig R, LCSWA 08/16/2017, 8:15 PM

## 2017-08-16 NOTE — Progress Notes (Signed)
MD made aware that pt.'s respirations are 32-36 per minute, her breathing is shallow and labored, pt is still not verbalizing with nursing staff. New orders placed py MD. Will continue to monitor pt.   Kimberly Schneider [employer]

## 2017-08-16 NOTE — Consult Note (Signed)
Kimberly Schneider , MD [address], [address], [place], [place], [id number] [address], [address], [place], [place], [address] Phone: [phone]  Fax: [phone]  Consultation  Referring Provider:   DR Elisabeth PigeonVachhani Primary Care Physician:  Lauro RegulusAnderson, Marshall W, MD Primary Gastroenterologist: None Reason for Consultation:     Nausea  Date of Admission:  08/15/2017 Date of Consultation:  08/16/2017         HPI:   Kimberly Schneider is a 84 y.o. female was sent to the hospital yesterday as she was noted to have a low HB of 3.9 grams She is on Xarelto at home. She has dementia and unable to manage her activities of daily living. She has a low MCVof 65. She has received PRBC transfusion .   Patient unable to provide a history , when I entered the room she was being changed and had green colored stool in her pad.   Past Medical History:  Diagnosis Date  . History of blood clots   . Hypothyroidism     Past Surgical History:  Procedure Laterality Date  . ORIF PATELLA Right 10/27/2012   Procedure: OPEN REDUCTION INTERNAL (ORIF) FIXATION PATELLA;  Surgeon: Thera FlakeW D Caffrey Jr., MD;  Location: [facility] OR;  Service: Orthopedics;  Laterality: Right;    Prior to Admission medications   Medication Sig Start Date End Date Taking? Authorizing Provider  levothyroxine (SYNTHROID, LEVOTHROID) 25 MCG tablet Take 1 tablet (25 mcg total) by mouth daily before breakfast. 10/29/12  Yes Chadwell, Ivin BootyJoshua, PA-C  pantoprazole (PROTONIX) 20 MG tablet Take 1 tablet by mouth daily. 07/18/17  Yes [provider]  XARELTO 20 MG TABS tablet Take 1 tablet by mouth daily. 07/26/17  Yes [provider]  acetaminophen (TYLENOL) 325 MG tablet Take 2 tablets (650 mg total) by mouth every 6 (six) hours as needed for pain. 10/29/12   Chadwell, Ivin BootyJoshua, PA-C  docusate sodium 100 MG CAPS Take 100 mg by mouth 2 (two) times daily. 10/29/12   Chadwell, Ivin BootyJoshua, PA-C  enoxaparin (LOVENOX) 40 MG/0.4ML injection Inject 0.4 mLs (40 mg  total) into the skin daily. Patient not taking: Reported on 08/15/2017 10/29/12   Margart Sickleshadwell, Joshua, PA-C  HYDROcodone-acetaminophen (NORCO/VICODIN) 5-325 MG per tablet Take 1-2 tablets by mouth every 6 (six) hours as needed. Patient not taking: Reported on 08/15/2017 10/29/12   Margart Sickleshadwell, Joshua, PA-C    Family History  Problem Relation Age of Onset  . Diabetes Mother   . Heart failure Father   . Heart failure Brother      Social History   Tobacco Use  . Smoking status: Never Smoker  . Smokeless tobacco: Never Used  Substance Use Topics  . Alcohol use: Never    Frequency: Never  . Drug use: Not on file    Allergies as of 08/15/2017  . (No Known Allergies)    Review of Systems:    Unable to provide    Physical Exam:  Vital signs in last 24 hours: Temp:  [97.5 F (36.4 C)-98.7 F (37.1 C)] 97.9 F (36.6 C) (04/25 0840) Pulse Rate:  [69-94] 89 (04/25 0829) Resp:  [16-24] 24 (04/25 0840) BP: (105-153)/(34-104) 114/63 (04/25 0829) SpO2:  [56 %-100 %] 95 % (04/25 0907) Weight:  [110 lb (49.9 kg)] 110 lb (49.9 kg) (04/24 1649)   General:   cnfused  Head:  Normocephalic and atraumatic. Eyes:   No icterus.   Conjunctiva pink. PERRLA. Ears: cannot assess Neck:  Supple; no masses or thyroidomegaly Lungs:  Respirations even and unlabored. Lungs clear to auscultation bilaterally.   No wheezes, crackles, or rhonchi.  Heart:  Regular rate and rhythm;  Without murmur, clicks, rubs or gallops Abdomen:  Soft, nondistended, nontender. Normal bowel sounds. No appreciable masses or hepatomegaly.  No rebound or guarding.  Neurologic:  Alert and oriented x 0. Skin:  Intact without significant lesions or rashes. Cervical Nodes:  No significant cervical adenopathy. Psych:  Confused   LAB RESULTS: Recent Labs    08/15/17 1715 08/16/17 0419 08/16/17 0917  WBC 7.4  --   --   HGB 3.9* 7.5* 8.0*  HCT 12.8* 23.7* 24.8*  PLT 295  --   --    BMET Recent Labs    08/15/17 1715  08/16/17 0419  NA 146* 147*  K 4.6 4.1  CL 118* 119*  CO2 22 21*  GLUCOSE 177* 158*  BUN 42* 39*  CREATININE 1.08* 1.22*  CALCIUM 8.5* 8.0*   LFT Recent Labs    08/15/17 1715  PROT 6.3*  ALBUMIN 3.3*  AST 41  ALT 24  ALKPHOS 69  BILITOT 0.3   PT/INR No results for input(s): LABPROT, INR in the last 72 hours.  STUDIES: Dg Chest Portable 1 View  Result Date: 08/15/2017 CLINICAL DATA:  84 y/o  F; fatigue and altered mental status. EXAM: PORTABLE CHEST 1 VIEW COMPARISON:  None. FINDINGS: Mild cardiomegaly. Aortic atherosclerosis with calcification. Reticular opacities of the lungs. No focal consolidation. No pleural effusion or pneumothorax. Mild thoracolumbar spine levocurvature. IMPRESSION: Reticular opacities of the lungs, probably interstitial edema. Mild cardiomegaly. Electronically Signed   By: Mitzi Hansen M.D.   On: 08/15/2017 17:46      Impression / Plan:   Kimberly Schneider is a 84 y.o. y/o female referred to the hospital by Lauro Regulus, MD for low HB. She has advanced dementia and is dependant on care for all activities of daily living . She has a severe microcytic anemia with no clear overt blood loss visible. She very likely has a chronic issue either from poor oral intake or from blood loss from a lesion such as an AVM or even a malignancy. At her age and with her advanced dementia , the benefits of any endoscopic procedure must be balanced with the risks. Probably a good start would be to establish goals of care to determine how aggressive the treatment should be considering her dementia , understand the families needs and patients prior wishes when she was able to make her decisions. She is on xarelto and hence no procedures can be done atleast for 2-3 days. In the meanwhile we would need a clear consensus from the family. I do see the admission note that states the family did not want any endoscopic procedures and if that is true suggest checking  iron studies and if low give IV iron , continue PPI and treat symptomatically.    Thank you for involving me in the care of this patient.      LOS: 1 day   Kimberly Mood, MD  08/16/2017, 10:22 AM

## 2017-08-17 DIAGNOSIS — Z515 Encounter for palliative care: Secondary | ICD-10-CM

## 2017-08-17 DIAGNOSIS — Z7189 Other specified counseling: Secondary | ICD-10-CM

## 2017-08-17 LAB — TYPE AND SCREEN
ABO/RH(D): A POS
Antibody Screen: NEGATIVE
UNIT DIVISION: 0
UNIT DIVISION: 0
Unit division: 0
Unit division: 0

## 2017-08-17 LAB — CBC
HCT: 23.1 % — ABNORMAL LOW (ref 35.0–47.0)
Hemoglobin: 7.3 g/dL — ABNORMAL LOW (ref 12.0–16.0)
MCH: 23.1 pg — ABNORMAL LOW (ref 26.0–34.0)
MCHC: 31.5 g/dL — AB (ref 32.0–36.0)
MCV: 73.5 fL — AB (ref 80.0–100.0)
Platelets: 236 10*3/uL (ref 150–440)
RBC: 3.14 MIL/uL — ABNORMAL LOW (ref 3.80–5.20)
RDW: 25.8 % — AB (ref 11.5–14.5)
WBC: 9.2 10*3/uL (ref 3.6–11.0)

## 2017-08-17 LAB — BASIC METABOLIC PANEL
Anion gap: 7 (ref 5–15)
BUN: 30 mg/dL — ABNORMAL HIGH (ref 6–20)
CALCIUM: 7.5 mg/dL — AB (ref 8.9–10.3)
CO2: 26 mmol/L (ref 22–32)
CREATININE: 1.3 mg/dL — AB (ref 0.44–1.00)
Chloride: 115 mmol/L — ABNORMAL HIGH (ref 101–111)
GFR calc Af Amer: 39 mL/min — ABNORMAL LOW (ref >60)
GFR calc non Af Amer: 34 mL/min — ABNORMAL LOW (ref >60)
GLUCOSE: 168 mg/dL — AB (ref 65–99)
Potassium: 3.1 mmol/L — ABNORMAL LOW (ref 3.5–5.1)
Sodium: 148 mmol/L — ABNORMAL HIGH (ref 135–145)

## 2017-08-17 LAB — IRON AND TIBC
Iron: 9 ug/dL — ABNORMAL LOW (ref 28–170)
Saturation Ratios: 3 % — ABNORMAL LOW (ref 10.4–31.8)
TIBC: 332 ug/dL (ref 250–450)
UIBC: 323 ug/dL

## 2017-08-17 LAB — BPAM RBC
BLOOD PRODUCT EXPIRATION DATE: 201905182359
BLOOD PRODUCT EXPIRATION DATE: 201905212359
Blood Product Expiration Date: 201905212359
Blood Product Expiration Date: 201905212359
ISSUE DATE / TIME: 201904241842
ISSUE DATE / TIME: 201904242150
ISSUE DATE / TIME: 201904251250
UNIT TYPE AND RH: 5100
UNIT TYPE AND RH: 5100
Unit Type and Rh: 5100
Unit Type and Rh: 6200

## 2017-08-17 LAB — PREPARE RBC (CROSSMATCH)

## 2017-08-17 MED ORDER — POTASSIUM CHLORIDE CRYS ER 20 MEQ PO TBCR
40.0000 meq | EXTENDED_RELEASE_TABLET | Freq: Once | ORAL | Status: AC
  Administered 2017-08-17: 40 meq via ORAL
  Filled 2017-08-17: qty 2

## 2017-08-17 MED ORDER — SODIUM CHLORIDE 0.45 % IV SOLN
Freq: Once | INTRAVENOUS | Status: AC
  Administered 2017-08-17: 17:00:00 via INTRAVENOUS

## 2017-08-17 MED ORDER — PANTOPRAZOLE SODIUM 40 MG PO TBEC
40.0000 mg | DELAYED_RELEASE_TABLET | Freq: Every day | ORAL | Status: DC
  Administered 2017-08-17 – 2017-08-18 (×2): 40 mg via ORAL
  Filled 2017-08-17 (×2): qty 1

## 2017-08-17 MED ORDER — SODIUM CHLORIDE 0.9 % IV SOLN
150.0000 mg | INTRAVENOUS | Status: DC
  Administered 2017-08-17 – 2017-08-18 (×2): 150 mg via INTRAVENOUS
  Filled 2017-08-17 (×2): qty 7.5

## 2017-08-17 MED ORDER — FUROSEMIDE 10 MG/ML IJ SOLN: 20.0000 mg | Freq: Every day | INTRAMUSCULAR | Status: DC

## 2017-08-17 MED ORDER — FERROUS SULFATE 325 (65 FE) MG PO TABS
325.0000 mg | ORAL_TABLET | Freq: Two times a day (BID) | ORAL | Status: DC
  Administered 2017-08-17 – 2017-08-18 (×3): 325 mg via ORAL
  Filled 2017-08-17 (×3): qty 1

## 2017-08-17 NOTE — Progress Notes (Signed)
[facility] - [place] at [facility]   PATIENT NAME: Kimberly Schneider    MR#:  [id number]  DATE OF BIRTH:  January 29, 1922  SUBJECTIVE:   Patient is pleasantly confused. Family in the room. She is trying to be clear liquid diet. No issues but Charity fundraiser. Tolerating liquid diet well. No active G.I. bleeding noted. Breathing much improved. REVIEW OF SYSTEMS:   Review of Systems  Unable to perform ROS: Dementia   Tolerating Diet:yesTolerating PT: HHPT with supervision  DRUG ALLERGIES:  No Known Allergies  VITALS:  Blood pressure 118/66, pulse 81, temperature 98.8 F (37.1 C), temperature source Axillary, resp. rate (!) 21, height 5\' 1"  (1.549 m), weight 49.9 kg (110 lb), SpO2 96 %.  PHYSICAL EXAMINATION:   Physical Exam  GENERAL:  84 y.o.-year-old patient lying in the bed with no acute distress.  EYES: Pupils equal, round, reactive to light and accommodation. No scleral icterus. Extraocular muscles intact.  HEENT: Head atraumatic, normocephalic. Oropharynx and nasopharynx clear.  NECK:  Supple, no jugular venous distention. No thyroid enlargement, no tenderness.  LUNGS: Normal breath sounds bilaterally, no wheezing, rales, rhonchi. No use of accessory muscles of respiration.  CARDIOVASCULAR: S1, S2 normal. No murmurs, rubs, or gallops.  ABDOMEN: Soft, nontender, nondistended. Bowel sounds present. No organomegaly or mass.  EXTREMITIES: No cyanosis, clubbing or edema b/l.    NEUROLOGIC: was all extremity as well. Limited secondary to patient's advanced dementia. No focal neuro- deficit noted. PSYCHIATRIC:  patient is alert but pleasantly confused secondary to dementia. SKIN: No obvious rash, lesion, or ulcer.   LABORATORY PANEL:  CBC Recent Labs  Lab 08/17/17 0526  WBC 9.2  HGB 7.3*  HCT 23.1*  PLT 236    Chemistries  Recent Labs  Lab 08/15/17 1715  08/17/17 0526  NA 146*   < > 148*  K 4.6   < > 3.1*  CL 118*   < > 115*  CO2 22   < > 26  GLUCOSE 177*    < > 168*  BUN 42*   < > 30*  CREATININE 1.08*   < > 1.30*  CALCIUM 8.5*   < > 7.5*  AST 41  --   --   ALT 24  --   --   ALKPHOS 69  --   --   BILITOT 0.3  --   --    < > = values in this interval not displayed.   Cardiac Enzymes No results for input(s): TROPONINI in the last 168 hours. RADIOLOGY:  Dg Chest 1 View  Result Date: 08/16/2017 CLINICAL DATA:  Pt's respirations are 32-36 per minute, her breathing is shallow and labored. was sent to the hospital yesterday as she was noted to have a low HB of 3.9 grams Never a smoker EXAM: CHEST  1 VIEW COMPARISON:  08/15/2017 and 04/23/2015 FINDINGS: The heart is enlarged. There is increased perihilar pulmonary edema. There are no focal consolidations. There are small bilateral pleural effusions. Large hiatal hernia is again seen. IMPRESSION: Increased pulmonary edema. Electronically Signed   By: Norva Pavlov M.D.   On: 08/16/2017 16:10   Dg Chest Portable 1 View  Result Date: 08/15/2017 CLINICAL DATA:  84 y/o  F; fatigue and altered mental status. EXAM: PORTABLE CHEST 1 VIEW COMPARISON:  None. FINDINGS: Mild cardiomegaly. Aortic atherosclerosis with calcification. Reticular opacities of the lungs. No focal consolidation. No pleural effusion or pneumothorax. Mild thoracolumbar spine levocurvature. IMPRESSION: Reticular opacities of the lungs, probably interstitial edema. Mild  cardiomegaly. Electronically Signed   By: Mitzi HansenLance  Furusawa-Stratton M.D.   On: 08/15/2017 17:46   ASSESSMENT AND PLAN:  84 year old elderly female patient with history of hypothyroidism, DVT in the leg, dementia was referred from primary care physician office for low hemoglobin.  * Symptomatic anemia Transfused 2 units PRBC IV  Hemoglobin came up, continue monitoring now and transfuse if needed. Came in with hemoglobin of 3.9--- two units of blood transfusion--- 8.7--- 7.3 -I spoke with patient's niece's husband Rocky LinkKen-- will start patient on IV iron infusion. Family okay  with it. Will also start her on oral iron supplement.  * Gastrointestinal bleeding-- appears chronic slow no source identified Monitor hemoglobin and hematocrit closely IV protonix every 12 hourly --- change to oral PPI gastroenterology consultation appreciated, due to old age, and family preference- no procedures suggested at this time. -Will advance to soft  * Acute respiratory failure with hypoxia secondary to pulmonary edema from transfusion   Patient had respiratory distress after receiving 2 units of transfusion and continues IV fluids,  -patient received two doses of IV Lasix she is breathing much better how were she is appearing to have elevated sodium and chloride--- I will give her IV fluid at a very low rate see if it improves.   * History of DVT in the lower extremity Hold anticoagulation for now in view of GI bleed--- family aware patient will be taken off Xarelto setting of slow G.I. bleed with iron deficiency anemia.  * Advanced dementia Supportive care physical therapy recommends home health with supervision. I had a very long discussion with Mr.Ken over on the phone and given patient's advanced dementia patient is not a candidate for extensive rehab. She will be discharged to home with home health PT and RN and family can resume their private caregivers as before.   Case discussed with Care Management/Social Worker. Management plans discussed with the patient, family and they are in agreement.  CODE STATUS: DNR  DVT Prophylaxis: SCD  TOTAL TIME TAKING CARE OF THIS PATIENT: 35 minutes.  >50% time spent on counselling and coordination of care  POSSIBLE D/C IN 1 DAYS, DEPENDING ON CLINICAL CONDITION.  Note: This dictation was prepared with Dragon dictation along with smaller phrase technology. Any transcriptional errors that result from this process are unintentional.  Enedina FinnerSona Tesla Keeler M.D on 08/17/2017 at 4:23 PM  Between 7am to 6pm - Pager - [phone]  After  6pm go to [email or web address] - Social research officer, governmentpassword EPAS [facility]  [facility] [place] [facility]  Office  [phone]  CC: Primary care physician; Lauro RegulusAnderson, Marshall W, MDPatient ID: Koleen NimrodFrances C Schneider, female   DOB: 08/29/1921, 84 y.o.   MRN: [id number]

## 2017-08-17 NOTE — Care Management (Addendum)
Spoke with patient's nieceHilda Lias- Marie.  Discussed physical therapy recommendations of home with home health.  Patient was not able to follow instructions of therapist so it rehab potential is poor. Hilda LiasMarie says that patient was ambulatory "last week." She says that patient has been very sleepy and concern that it is from Haldol that she has received.  Says patient is not usually like this.   Discussed that anticipate discharge 08/18/2017. Discussed that patient can not be left alone. Hilda LiasMarie and family have been discussing facility placement - memory care specifically. Hilda LiasMarie says that there are no caregivers/ family to provide round the clock care and supervision. .  Provided list of in home care givers.   Discussed that doubt could find a facility by tomorrow- . Many questions about memory care units, applying for medicaid, which facilities would accept medicaid, etc. Patient has less than 2000 dollars in assets.  Discussed that if proceeded directly to a facility, patient would be liable for cost until approved for medicaid.  No agency preference for home health services.  Heads up to [facility] for RN PT OT Aide and SW.

## 2017-08-17 NOTE — Care Management Important Message (Signed)
Copy of signed IM left in patient's room.    

## 2017-08-17 NOTE — Consult Note (Addendum)
Consultation Note Date: 08/17/2017   Patient Name: Kimberly Schneider  DOB: 16-Kimberly Schneider-1923  MRN: [id number]  Age / Sex: 84 y.o., female  PCP: Lauro Regulus, MD Referring Physician: Enedina Finner, MD  Reason for Consultation: Establishing goals of care  HPI/Patient Profile:  Kimberly Schneider  is a 84 y.o. female with a known history of hypothyroidism was referred from primary care physician office for low hemoglobin.  Patient hemoglobin was low as 3.9. She is on oral Xarelto at home.     Clinical Assessment and Goals of Care: Spoke with Hilda Lias, the patient's sister and emergency contact listed. She states they have other siblings but they are probably not involved with decision making for [name]. She states Kimberly Schneider has been the primary person that has helped her. She states Kimberly Schneider's mental status waxes and wanes. She tells me she gets confused sometimes; she can be "with it" at times, but has been confused most of the time. She has had dementia for the last 10 years. Per Hilda Lias, she lives with friends. She has been eating well, and comes over every Sunday for dinner. She states her sister does not use any assistive devices.   She states there is no POA. Attempted to discuss GOC to determine if she knew of Kimberly Schneider's wishes, however she states she has not been to see her here and does not know what Ms. Tober's wishes would be. She states that Kimberly Schneider has been here and is making decisions, and has been a big help.   Spoke with Kimberly Schneider. She states her mother is Hilda Lias. She states that there are no living siblings besides the patient and Hilda Lias. She tells me she is the POA for Kimberly Schneider. She states Kimberly Schneider was diagnosed with dementia 4 years ago. She states Kimberly Schneider lives at home, and has a caregiver, and the family comes to help. She uses no assistive devices and has a good appetite. She states Kimberly Schneider has a living  will. She tells me the patient would never want feeding tube or dialysis. She states she would like to continue to treat the treatable. She confirms DNR status. She would like for her aunt to return to her status prior to this hospitalization.     SUMMARY OF RECOMMENDATIONS     Recommend palliative to follow outpatient.    Code Status/[facility] Care Planning:  DNR    Symptom Management:   Per primary team.  Palliative Prophylaxis:   Eye Care and Oral Care   Prognosis:   Poor long term      Primary Diagnoses: Present on Admission: . GI bleed   I have reviewed the medical record, interviewed the patient and family, and examined the patient. The following aspects are pertinent.  Past Medical History:  Diagnosis Date  . History of blood clots   . Hypothyroidism    Social History   Socioeconomic History  . Marital status: Married    Spouse name: Not on file  . Number of children: Not on  file  . Years of education: Not on file  . Highest education level: Not on file  Occupational History  . Occupation: retired  Engineer, productionocial Needs  . Financial resource strain: Not on file  . Food insecurity:    Worry: Not on file    Inability: Not on file  . Transportation needs:    Medical: Not on file    Non-medical: Not on file  Tobacco Use  . Smoking status: Never Smoker  . Smokeless tobacco: Never Used  Substance and Sexual Activity  . Alcohol use: Never    Frequency: Never  . Drug use: Not on file  . Sexual activity: Not on file  Lifestyle  . Physical activity:    Days per week: Not on file    Minutes per session: Not on file  . Stress: Not on file  Relationships  . Social connections:    Talks on phone: Not on file    Gets together: Not on file    Attends religious service: Not on file    Active member of club or organization: Not on file    Attends meetings of clubs or organizations: Not on file    Relationship status: Not on file  Other Topics Concern  . Not  on file  Social History Narrative  . Not on file   Family History  Problem Relation Age of Onset  . Diabetes Mother   . Heart failure Father   . Heart failure Brother    Scheduled Meds: . ferrous sulfate  325 mg Oral BID WC  . ipratropium-albuterol  3 mL Nebulization Q4H  . levothyroxine  25 mcg Oral QAC breakfast  . pantoprazole  40 mg Oral Daily   Continuous Infusions: . sodium chloride    . iron sucrose     PRN Meds:.acetaminophen **OR** acetaminophen, ondansetron **OR** ondansetron (ZOFRAN) IV Medications Prior to Admission:  Prior to Admission medications   Medication Sig Start Date End Date Taking? Authorizing Provider  levothyroxine (SYNTHROID, LEVOTHROID) 25 MCG tablet Take 1 tablet (25 mcg total) by mouth daily before breakfast. 10/29/12  Yes Chadwell, Ivin BootyJoshua, PA-C  pantoprazole (PROTONIX) 20 MG tablet Take 1 tablet by mouth daily. 07/18/17  Yes [provider]  XARELTO 20 MG TABS tablet Take 1 tablet by mouth daily. 07/26/17  Yes [provider]  acetaminophen (TYLENOL) 325 MG tablet Take 2 tablets (650 mg total) by mouth every 6 (six) hours as needed for pain. 10/29/12   Chadwell, Ivin BootyJoshua, PA-C  docusate sodium 100 MG CAPS Take 100 mg by mouth 2 (two) times daily. 10/29/12   Chadwell, Ivin BootyJoshua, PA-C  enoxaparin (LOVENOX) 40 MG/0.4ML injection Inject 0.4 mLs (40 mg total) into the skin daily. Patient not taking: Reported on 08/15/2017 10/29/12   Margart Sickleshadwell, Joshua, PA-C  HYDROcodone-acetaminophen (NORCO/VICODIN) 5-325 MG per tablet Take 1-2 tablets by mouth every 6 (six) hours as needed. Patient not taking: Reported on 08/15/2017 10/29/12   Margart Sickleshadwell, Joshua, PA-C   No Known Allergies Review of Systems  Unable to perform ROS   Physical Exam  Constitutional:  Very confused. Unable to answer any questions including her name.   Pulmonary/Chest: Effort normal.  Neurological: She is alert.  Skin: Skin is warm and dry.    Vital Signs: BP 118/66 (BP Location: Left Arm)    Pulse 81   Temp 98.8 F (37.1 C) (Axillary)   Resp (!) 21   Ht 5\' 1"  (1.549 m)   Wt 49.9 kg (110 lb)   SpO2 96%  BMI 20.78 kg/m  Pain Scale: Faces   Pain Score: Asleep   SpO2: SpO2: 96 % O2 Device:SpO2: 96 % O2 Flow Rate: .O2 Flow Rate (L/min): 4 L/min  IO: Intake/output summary:   Intake/Output Summary (Last 24 hours) at 08/17/2017 1647 Last data filed at 08/17/2017 1431 Gross per 24 hour  Intake 240 ml  Output 1275 ml  Net -1035 ml    LBM: Last BM Date: 08/16/17 Baseline Weight: Weight: 49.9 kg (110 lb) Most recent weight: Weight: 49.9 kg (110 lb)     Palliative Assessment/Data: 30%     Time In: 4:30 Time Out: 5:40 Time Total: 70 min Greater than 50%  of this time was spent counseling and coordinating care related to the above assessment and plan.  Signed by: Morton Stall, NP   Please contact Palliative Medicine Team phone at [phone] for questions and concerns.  For individual provider: See Loretha Stapler

## 2017-08-17 NOTE — Evaluation (Signed)
Physical Therapy Evaluation Patient Details Name: Kimberly Schneider MRN: [id number] DOB: 1921/12/30 Today's Date: 08/17/2017   History of Present Illness  84 y.o. female with a history of hypothyroidism, advanced dementia, DVT.  She was referred from primary care physician office for low hemoglobin (as low as 3.9) Pt has had transfusions with improved HGB levels.  Clinical Impression  Difficult PT exam as pt had poor ability to follow instructions this date.  Her eyes stayed open once she woke, but she  displayed flat affect most of the time (hardly making eye contact).  When PT gave her a sock to don she put in on her hand like a glove, PT took it off and encouraged her to put in on her foot, she again put it on her hand...  Pt struggled to do much meaningful mobility, strength testing, etc; and though she does have dementia at baseline the caregiver reports that she generally is much more alert and capable than she was able to do today.  Pt very weak with attempt at standing and could not shift weight forward enough to even get back of legs off bed despite heavy assist.  If pt has improved cognitive status and is better able to participate more specific recommendations would be appropriate befitting the situation. Regardless of rehab vs HHPT pt will need 24 hr assist. Note: Spoke with pt's nephew-in-law and family's aim is for pt to go to rehab as she is so much weaker now than her baseline with the hope of quickly returning to baseline function.       Follow Up Recommendations Supervision/Assistance - 24 hour Further recommendations per progress: short term rehab vs HHPT.  Pt is not at all near her baseline both physically and mentally and per ability to follow instructions she would benefit from STR to return to her prior functional status (walking w/o AD, out of home trips weekly, etc).      Equipment Recommendations  apparently she has (but has not needed) walker and cane?    Recommendations  for Other Services       Precautions / Restrictions Precautions Precautions: Fall Restrictions Weight Bearing Restrictions: No      Mobility  Bed Mobility Overal bed mobility: Needs Assistance Bed Mobility: Supine to Sit;Sit to Supine     Supine to sit: Max assist Sit to supine: Max assist   General bed mobility comments: max assist to get to sitting secondary to inability to initiate movement or follow instructions to try.  Once assisted to sitting she was able to maintain sitting balance (pt kept knees extended, verbal and tactile cues to put feet on floor were largely unsuccessful)   Transfers Overall transfer level: Needs assistance Equipment used: 1 person hand held assist Transfers: Sit to/from Stand Sit to Stand: Max assist;Mod assist         General transfer comment: Finally able to get pt to put feet on the ground and assisted her to standing (she was unable to initiate standing with multiple cues).  She was unable to maintain balance and even leaning back of legs on bed she was unable to stay upright w/o heavy assist  Ambulation/Gait             General Gait Details: Attempted to take a side step at EOB with HHA but pt was completely unsafe.  Walker in room, but as pt was profoundly confused and does not use it at baseline we did not attempt any further ambulation attempts for  safety reasons.   Stairs            Wheelchair Mobility    Modified Rankin (Stroke Patients Only)       Balance Overall balance assessment: Needs assistance Sitting-balance support: Feet unsupported;Bilateral upper extremity supported Sitting balance-Leahy Scale: Good Sitting balance - Comments: Pt able to maintain sitting balance once assisted to the position     Standing balance-Leahy Scale: Poor Standing balance comment: Pt confused and very weak.  She leaned back of legs heavily on bed and still needed max assist to maintain upright                              Pertinent Vitals/Pain Pain Assessment: (unable to rate, did not indicate pain t/o PT exam)    Home Living Family/patient expects to be discharged to:: Unsure Living Arrangements: Alone(steps (with rail) to enter, has ADs but does not use)               Additional Comments: Pt has caregiver/family support roughly 8am-8pm but spends nights alone    Prior Function Level of Independence: Needs assistance   Gait / Transfers Assistance Needed: until 2 weeks ago apparently pt has not needed any phyiscal assist/assistive device getting out of the home at least weekly.  Dementia did necessitate supervision while out of home.  ADL's / Homemaking Assistance Needed: aide assisted with baths, getting dressed, apparently pt is incontinent much of the time  Comments: caregiver present during exam, reports that over the last 2 weeks pt seems to have gotten considerably weaker      Hand Dominance        Extremity/Trunk Assessment   Upper Extremity Assessment Upper Extremity Assessment: Difficult to assess due to impaired cognition    Lower Extremity Assessment Lower Extremity Assessment: Difficult to assess due to impaired cognition       Communication   Communication: Receptive difficulties;Expressive difficulties(very minimal vocalizations)  Cognition Arousal/Alertness: (eyes open, profoundly confused) Behavior During Therapy: Flat affect Overall Cognitive Status: Difficult to assess                                 General Comments: history of dementia, appears more severe than even her baseline status      General Comments      Exercises     Assessment/Plan    PT Assessment Patient needs continued PT services  PT Problem List Decreased strength;Decreased range of motion;Decreased activity tolerance;Decreased balance;Decreased mobility;Decreased coordination;Decreased cognition;Decreased knowledge of use of DME;Decreased safety awareness       PT  Treatment Interventions Gait training;Stair training;Functional mobility training;Therapeutic activities;Therapeutic exercise;Balance training;DME instruction;Neuromuscular re-education;Cognitive remediation;Patient/family education    PT Goals (Current goals can be found in the Care Plan section)  Acute Rehab PT Goals Patient Stated Goal: pt unable to state, family hoping she can go to rehab  PT Goal Formulation: Patient unable to participate in goal setting Time For Goal Achievement: 08/31/17 Potential to Achieve Goals: Fair    Frequency Min 2X/week   Barriers to discharge Decreased caregiver support Pt unsafe to be at home alone    Co-evaluation               AM-PAC PT "6 Clicks" Daily Activity  Outcome Measure Difficulty turning over in bed (including adjusting bedclothes, sheets and blankets)?: Unable Difficulty moving from lying on back to sitting on the  side of the bed? : Unable Difficulty sitting down on and standing up from a chair with arms (e.g., wheelchair, bedside commode, etc,.)?: Unable Help needed moving to and from a bed to chair (including a wheelchair)?: Total Help needed walking in hospital room?: Total Help needed climbing 3-5 steps with a railing? : Total 6 Click Score: 6    End of Session Equipment Utilized During Treatment: Gait belt;Oxygen Activity Tolerance: (limited bymental status/confusion, weakness) Patient left: with bed alarm set;with call bell/phone within reach;with family/visitor present Nurse Communication: Mobility status PT Visit Diagnosis: Muscle weakness (generalized) (M62.81);Difficulty in walking, not elsewhere classified (R26.2);Unsteadiness on feet (R26.81)    Time: 1330-1410 PT Time Calculation (min) (ACUTE ONLY): 40 min   Charges:   PT Evaluation $PT Eval Moderate Complexity: 1 Mod PT Treatments $Therapeutic Activity: 8-22 mins   PT G Codes:        Malachi Pro, DPT 08/17/2017, 3:40 PM

## 2017-08-17 NOTE — Clinical Social Work Note (Signed)
CSW received referral for SNF.  Case discussed with case manager and plan is to discharge home with home health.  CSW to sign off please re-consult if social work needs arise.  Derion Kreiter R. Jawana Reagor, MSW, LCSWA [phone]  

## 2017-08-18 LAB — BASIC METABOLIC PANEL
Anion gap: 5 (ref 5–15)
BUN: 17 mg/dL (ref 6–20)
CALCIUM: 7.4 mg/dL — AB (ref 8.9–10.3)
CHLORIDE: 111 mmol/L (ref 101–111)
CO2: 25 mmol/L (ref 22–32)
CREATININE: 0.88 mg/dL (ref 0.44–1.00)
GFR calc non Af Amer: 55 mL/min — ABNORMAL LOW (ref >60)
GLUCOSE: 136 mg/dL — AB (ref 65–99)
Potassium: 3.2 mmol/L — ABNORMAL LOW (ref 3.5–5.1)
Sodium: 141 mmol/L (ref 135–145)

## 2017-08-18 MED ORDER — PANTOPRAZOLE SODIUM 40 MG PO TBEC: 40.0000 mg | DELAYED_RELEASE_TABLET | Freq: Every day | ORAL | 1 refills | Status: AC

## 2017-08-18 MED ORDER — FERROUS SULFATE 325 (65 FE) MG PO TABS: 325.0000 mg | ORAL_TABLET | Freq: Two times a day (BID) | ORAL | 3 refills | Status: AC

## 2017-08-18 MED ORDER — IPRATROPIUM-ALBUTEROL 0.5-2.5 (3) MG/3ML IN SOLN: 3.0000 mL | RESPIRATORY_TRACT | Status: DC | PRN

## 2017-08-18 MED ORDER — FERROUS SULFATE 325 (65 FE) MG PO TABS: 325.0000 mg | ORAL_TABLET | Freq: Two times a day (BID) | ORAL | 3 refills | Status: DC

## 2017-08-18 MED ORDER — PANTOPRAZOLE SODIUM 40 MG PO TBEC: 40.0000 mg | DELAYED_RELEASE_TABLET | Freq: Every day | ORAL | 1 refills | Status: DC

## 2017-08-18 NOTE — Clinical Social Work Note (Signed)
The CSW spoke at length with the patient's niece [name] and her nephew-in-law, Rocky Link, about the barriers for placement and the next steps for longer term care. The CSW reiterated information give by another CSW and the [facility]. The CSW provided a list of resources including [place] [facility] for Medicaid, [facility] for caregiver support, and a comprehensive list of assisted living facilities with those having memory care units highlighted. The CSW also provided emotional support and explained the differences between Medicare and Medicaid as well as traditional Medicare vs. Managed Medicare. The CSW explained how a home health social worker would benefit the family for assistance with the longer term placement process. Finally, the CSW provided psychoeducation about dementia, inpatient delirium, and how returning to a familiar routine would benefit the patient's longer term prognosis. The family expressed disappointment in the system in general, but thanked the CSW for time and information. The CSW is signing off. Please consult should additional needs arise.  Argentina Ponder, MSW, Theresia Majors [phone]

## 2017-08-18 NOTE — Discharge Summary (Signed)
[facility] - [place] [facility]   PATIENT NAME: Kimberly Schneider    MR#:  [id number]  DATE OF BIRTH:  [birth year]  DATE OF ADMISSION:  08/15/2017 ADMITTING PHYSICIAN: Ihor Austin, MD  DATE OF DISCHARGE: 08/18/2017  PRIMARY CARE PHYSICIAN: Lauro Regulus, MD    ADMISSION DIAGNOSIS:  Rectal bleed [K62.5] Symptomatic anemia [D64.9]  DISCHARGE DIAGNOSIS:  severe iron deficiency anemia secondary to slow G.I. Bleed-- etiology unclear advanced dementia  SECONDARY DIAGNOSIS:   Past Medical History:  Diagnosis Date  . History of blood clots   . Hypothyroidism     HOSPITAL COURSE:   84 year old elderly female patient with history of hypothyroidism, DVT in the leg, dementia was referred from primary care physician office for low hemoglobin.  *Symptomatic anemia Transfused2units PRBC IV Hemoglobin came up, continue monitoring now and transfuse if needed. Came in with hemoglobin of 3.9--- two units of blood transfusion--- 8.7--- 7.3 -I spoke with patient's niece's husband Rocky Link-- pt recieved IV iron infusion. Family okay with it. -cont on oral iron supplement.  *Gastrointestinal bleeding-- appears chronic slow no source identified -IVprotonixevery 12 hourly --- change to oral PPI -gastroenterology consultationappreciated, due to old age, and family preference-no procedures suggested at this time. -Will advance to soft  *Acute respiratory failure with hypoxia secondary to pulmonary edema from transfusion Patient had respiratory distress after receiving 2 units of transfusion and continues IV fluids,  -patient received two doses of IV Lasix she is breathing much better how were she is appearing to have elevated sodium and chloride--- I will give her IV fluid at a very low rate see if it improves.--Labs much improved at discharge   *History of DVT in the lower extremity Hold anticoagulation for now in view of GI bleed--- family aware  patient will be taken off Xarelto setting of slow G.I. bleed with iron deficiency anemia.  *Advanced dementia Supportive care physical therapy recommends home health with supervision. I had a very long discussion with Mr.Ken over on the phone and given patient's advanced dementia patient is not a candidate for extensive rehab. She will be discharged to home with home health PT and RN and family can resume their private caregivers as before.  Discuss at length again with [name] and Rocky Link (family) and will arrange home health PT, RN, aide, OT, social worker.  D/c home  CONSULTS OBTAINED:  Treatment Team:  Altamese Dilling, MD  DRUG ALLERGIES:  No Known Allergies  DISCHARGE MEDICATIONS:   Allergies as of 08/18/2017   No Known Allergies     Medication List    STOP taking these medications   enoxaparin 40 MG/0.4ML injection Commonly known as:  LOVENOX   HYDROcodone-acetaminophen 5-325 MG tablet Commonly known as:  NORCO/VICODIN   XARELTO 20 MG Tabs tablet Generic drug:  rivaroxaban     TAKE these medications   acetaminophen 325 MG tablet Commonly known as:  TYLENOL Take 2 tablets (650 mg total) by mouth every 6 (six) hours as needed for pain.   DSS 100 MG Caps Take 100 mg by mouth 2 (two) times daily.   ferrous sulfate 325 (65 FE) MG tablet Take 1 tablet (325 mg total) by mouth 2 (two) times daily with a meal.   levothyroxine 25 MCG tablet Commonly known as:  SYNTHROID, LEVOTHROID Take 1 tablet (25 mcg total) by mouth daily before breakfast.   pantoprazole 40 MG tablet Commonly known as:  PROTONIX Take 1 tablet (40 mg total) by mouth daily. What changed:  medication strength  how much to take       If you experience worsening of your admission symptoms, develop shortness of breath, life threatening emergency, suicidal or homicidal thoughts you must seek medical attention immediately by calling 911 or calling your MD immediately  if symptoms less  severe.  You Must read complete instructions/literature along with all the possible adverse reactions/side effects for all the Medicines you take and that have been prescribed to you. Take any new Medicines after you have completely understood and accept all the possible adverse reactions/side effects.   Please note  You were cared for by a hospitalist during your hospital stay. If you have any questions about your discharge medications or the care you received while you were in the hospital after you are discharged, you can call the unit and asked to speak with the hospitalist on call if the hospitalist that took care of you is not available. Once you are discharged, your primary care physician will handle any further medical issues. Please note that NO REFILLS for any discharge medications will be authorized once you are discharged, as it is imperative that you return to your primary care physician (or establish a relationship with a primary care physician if you do not have one) for your aftercare needs so that they can reassess your need for medications and monitor your lab values. Today   SUBJECTIVE   Looks much alert and better family in the room VITAL SIGNS:  Blood pressure 109/64, pulse 89, temperature 98.7 F (37.1 C), temperature source Axillary, resp. rate 20, height  (1.549 m), weight 49.9 kg (110 lb), SpO2 100 %.  I/O:    Intake/Output Summary (Last 24 hours) at 08/18/2017 1411 Last data filed at 08/18/2017 1403 Gross per 24 hour  Intake 903.33 ml  Output -  Net 903.33 ml    PHYSICAL EXAMINATION:  GENERAL:  84 y.o.-year-old patient lying in the bed with no acute distress.  EYES: Pupils equal, round, reactive to light and accommodation. No scleral icterus. Extraocular muscles intact.  HEENT: Head atraumatic, normocephalic. Oropharynx and nasopharynx clear.  NECK:  Supple, no jugular venous distention. No thyroid enlargement, no tenderness.  LUNGS: Normal breath sounds  bilaterally, no wheezing, rales,rhonchi or crepitation. No use of accessory muscles of respiration.  CARDIOVASCULAR: S1, S2 normal. No murmurs, rubs, or gallops.  ABDOMEN: Soft, non-tender, non-distended. Bowel sounds present. No organomegaly or mass.  EXTREMITIES: No pedal edema, cyanosis, or clubbing.  NEUROLOGIC all extremities well. No focal neuro- deficit  PSYCHIATRIC: The patient is aler SKIN: No obvious rash, lesion, or ulcer.   DATA REVIEW:   CBC  Recent Labs  Lab 08/17/17 0526  WBC 9.2  HGB 7.3*  HCT 23.1*  PLT 236    Chemistries  Recent Labs  Lab 08/15/17 1715  08/18/17 0449  NA 146*   < > 141  K 4.6   < > 3.2*  CL 118*   < > 111  CO2 22   < > 25  GLUCOSE 177*   < > 136*  BUN 42*   < > 17  CREATININE 1.08*   < > 0.88  CALCIUM 8.5*   < > 7.4*  AST 41  --   --   ALT 24  --   --   ALKPHOS 69  --   --   BILITOT 0.3  --   --    < > = values in this interval not displayed.    Microbiology  Results   No results found for this or any previous visit (from the past 240 hour(s)).  RADIOLOGY:  Dg Chest 1 View  Result Date: 08/16/2017 CLINICAL DATA:  Pt's respirations are 32-36 per minute, her breathing is shallow and labored. was sent to the hospital yesterday as she was noted to have a low HB of 3.9 grams Never a smoker EXAM: CHEST  1 VIEW COMPARISON:  08/15/2017 and 04/23/2015 FINDINGS: The heart is enlarged. There is increased perihilar pulmonary edema. There are no focal consolidations. There are small bilateral pleural effusions. Large hiatal hernia is again seen. IMPRESSION: Increased pulmonary edema. Electronically Signed   By: Norva Pavlov M.D.   On: 08/16/2017 16:10     Management plans discussed with the patient, family and they are in agreement.  CODE STATUS:     Code Status Orders  (From admission, onward)        Start     Ordered   08/15/17 1815  Do not attempt resuscitation (DNR)  Continuous    Question Answer Comment  In the event of  cardiac or respiratory ARREST Do not call a "code blue"   In the event of cardiac or respiratory ARREST Do not perform Intubation, CPR, defibrillation or ACLS   In the event of cardiac or respiratory ARREST Use medication by any route, position, wound care, and other measures to relive pain and suffering. May use oxygen, suction and manual treatment of airway obstruction as needed for comfort.      08/15/17 1814    Code Status History    This patient has a current code status but no historical code status.    Advance Directive Documentation     Most Recent Value  Type of Advance Directive  Out of facility DNR (pink MOST or yellow form)  Pre-existing out of facility DNR order (yellow form or pink MOST form)  Physician notified to receive inpatient order  "MOST" Form in Place?  -      TOTAL TIME TAKING CARE OF THIS PATIENT: *40* minutes.    Enedina Finner M.D on 08/18/2017 at 2:11 PM  Between 7am to 6pm - Pager - [phone] After 6pm go to [email or web address] - Social research officer, government  [facility]  Office  [phone]  CC: Primary care physician; Lauro Regulus, MD

## 2017-08-18 NOTE — Progress Notes (Signed)
Physical Therapy Treatment Patient Details Name: Kimberly Schneider MRN: [id number] DOB: 09/19/1921 Today's Date: 08/18/2017    History of Present Illness 84 y.o. female with a history of hypothyroidism, advanced dementia, DVT.  She was referred from primary care physician office for low hemoglobin (as low as 3.9)    PT Comments    Pt demonstrating improved functional mobility. Pt does require heavy verbal and tactile cueing to initiate and maintain task at hand due to dementia. Pt able to demonstrate bed mobility without actual physical assist (only directional assist). Pt performs transfers and ambulation of 130 ft with Min guard of 2 for safety with mild occasional unsteadiness, but overall demonstrates need only for HHA; pt would be too unsteady without any support at this time. Pt will require 24 hour supervision/Min guard assist for mobility tasks for optimal safety at home.   Follow Up Recommendations  Supervision/Assistance - 24 hour     Equipment Recommendations       Recommendations for Other Services       Precautions / Restrictions Precautions Precautions: Fall Restrictions Weight Bearing Restrictions: No    Mobility  Bed Mobility Overal bed mobility: Modified Independent(assist only to stay on task) Bed Mobility: Supine to Sit;Sit to Supine     Supine to sit: Modified independent (Device/Increase time) Sit to supine: Modified independent (Device/Increase time)   General bed mobility comments: Pt needs assist only to encourage/stay on task at hand. Due to demenstia pt requires heavy cueing/encouragement and re direction  Transfers Overall transfer level: Needs assistance Equipment used: 2 person hand held assist Transfers: Sit to/from Stand Sit to Stand: Min assist         General transfer comment: Use of posterior legs against edge of bed. Mild unsteadiness initially. Demonstrates good eccentric control with sit post ambulation and a Min guard stand when  trying to place pt back to bed.   Ambulation/Gait Ambulation/Gait assistance: +2 safety/equipment;Min guard Ambulation Distance (Feet): 130 Feet Assistive device: 1 person hand held assist(another Min guard) Gait Pattern/deviations: Step-through pattern;Decreased stride length   Gait velocity interpretation: <1.8 ft/sec, indicate of risk for recurrent falls General Gait Details: looks to floor, mild R/L drift. Occasional mild unsteadiness. May be baseline   Stairs             Wheelchair Mobility    Modified Rankin (Stroke Patients Only)       Balance Overall balance assessment: Needs assistance Sitting-balance support: Feet unsupported;Bilateral upper extremity supported Sitting balance-Leahy Scale: Good     Standing balance support: Single extremity supported Standing balance-Leahy Scale: Fair                              Cognition Arousal/Alertness: Awake/alert Behavior During Therapy: (requires consistent re direction to task) Overall Cognitive Status: History of cognitive impairments - at baseline                                        Exercises      General Comments        Pertinent Vitals/Pain Pain Assessment: No/denies pain    Home Living                      Prior Function            PT Goals (current goals can now be  found in the care plan section) Progress towards PT goals: Progressing toward goals    Frequency    Min 2X/week      PT Plan Current plan remains appropriate    Co-evaluation              AM-PAC PT "6 Clicks" Daily Activity  Outcome Measure  Difficulty turning over in bed (including adjusting bedclothes, sheets and blankets)?: A Little Difficulty moving from lying on back to sitting on the side of the bed? : None Difficulty sitting down on and standing up from a chair with arms (e.g., wheelchair, bedside commode, etc,.)?: A Little Help needed moving to and from a bed to  chair (including a wheelchair)?: A Little Help needed walking in hospital room?: A Little Help needed climbing 3-5 steps with a railing? : A Lot 6 Click Score: 18    End of Session         PT Visit Diagnosis: Muscle weakness (generalized) (M62.81);Difficulty in walking, not elsewhere classified (R26.2);Unsteadiness on feet (R26.81)     Time: 1610-9604 PT Time Calculation (min) (ACUTE ONLY): 20 min  Charges:  $Gait Training: 8-22 mins                    G Codes:        Scot Dock, PTA 08/18/2017, 1:26 PM

## 2017-08-18 NOTE — Progress Notes (Signed)
Pt discharged per MD order. Discharge instructions reviewed with niece [name].Pharamcy reported to be incorrect. RN corrected in computer and contacted on call RN, Dr. Vachhani to [facility] pts [facility]isabeth [facility]onptions [facility]. All other questions answered to [facility] satisfaction. IV removed. Pt taken to car in wheelchair by staff.

## 2018-10-05 IMAGING — DX DG CHEST 1V PORT
1 series · 1 of 1 positions shown · non-contrast
Comparison: None.

CLINICAL DATA: [AGE]/o  F; fatigue and altered mental status.

EXAM:
PORTABLE CHEST 1 VIEW

[chest ap]
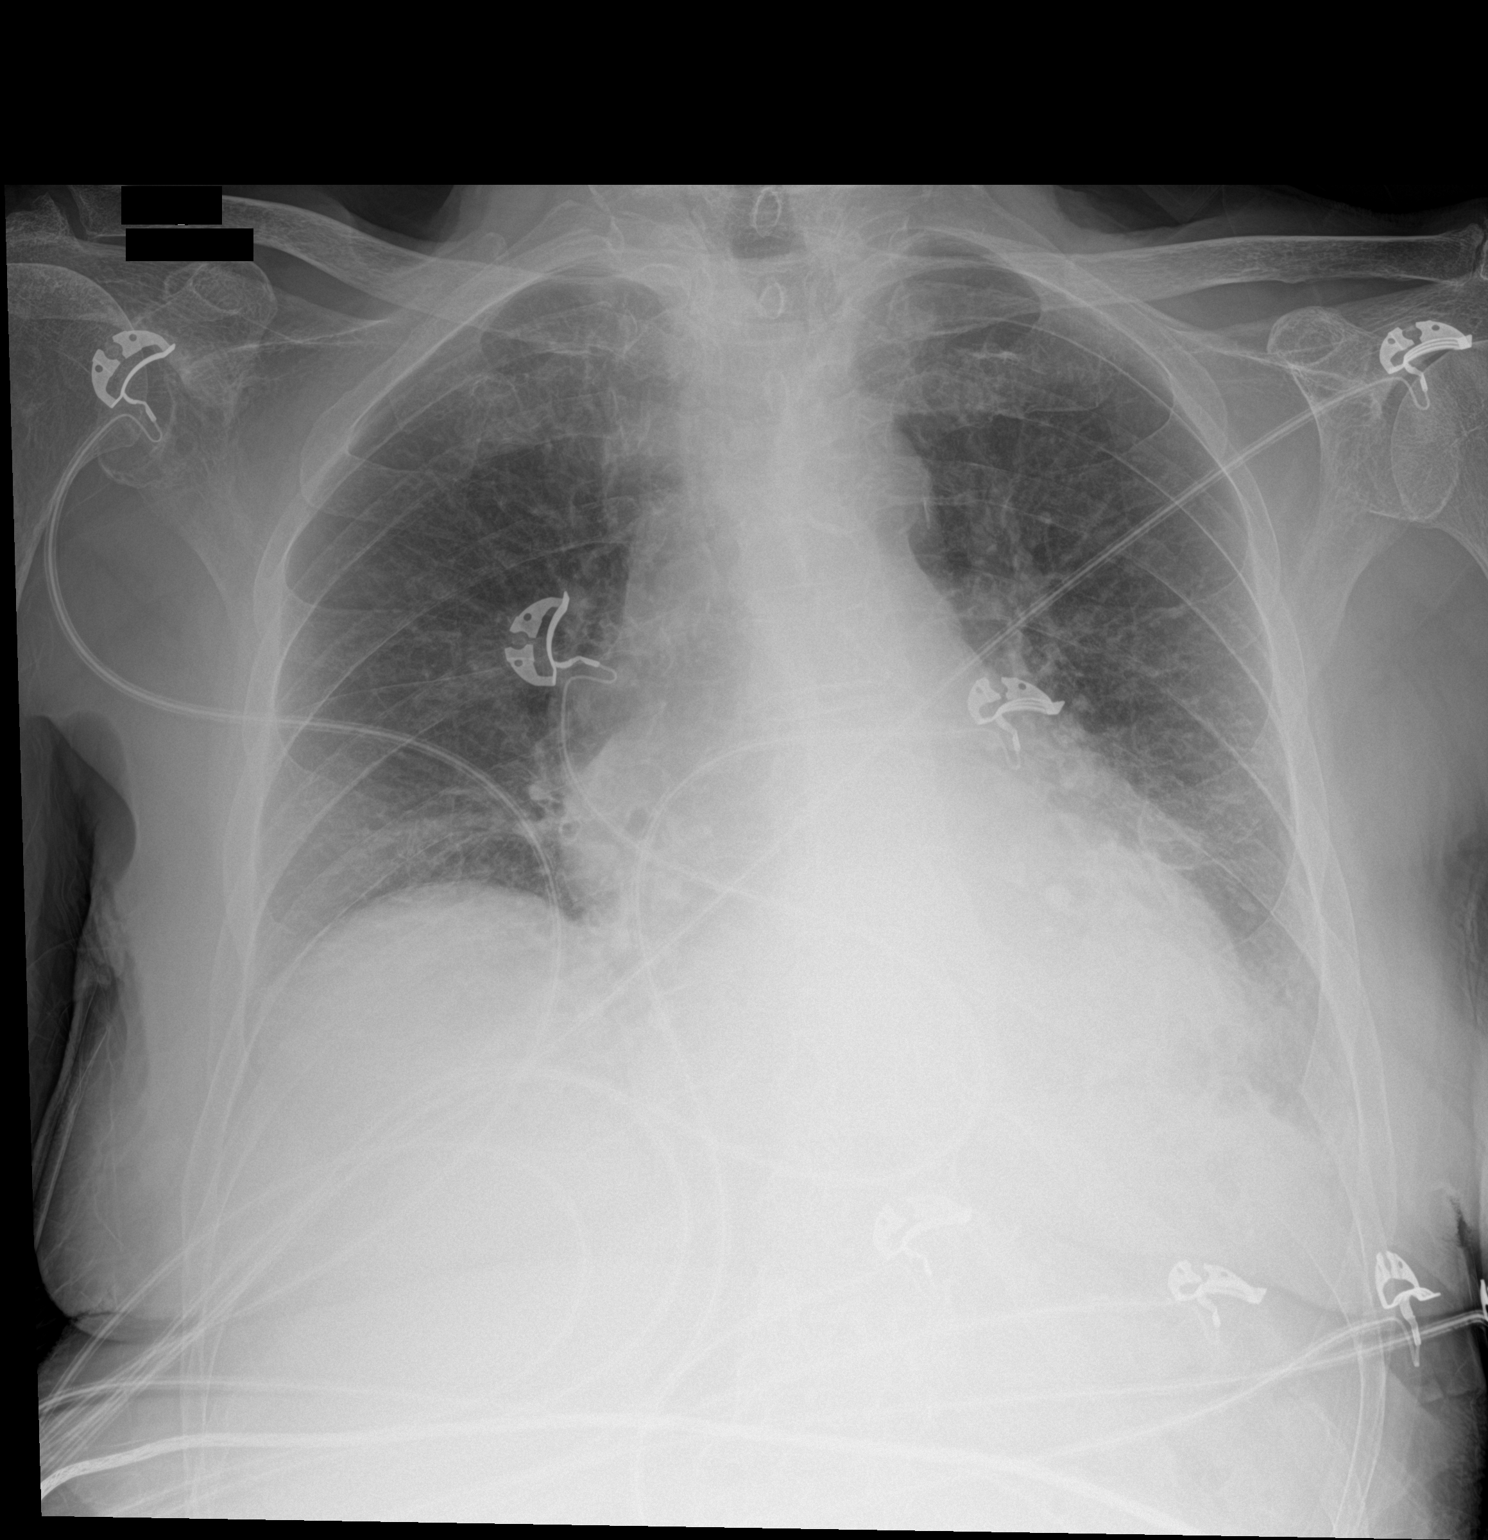

[1 of 1 positions shown; findings below may reference images not displayed]

FINDINGS: Mild cardiomegaly. Aortic atherosclerosis with calcification.
Reticular opacities of the lungs. No focal consolidation. No pleural
effusion or pneumothorax. Mild thoracolumbar spine levocurvature.
IMPRESSION: Reticular opacities of the lungs, probably interstitial edema. Mild
cardiomegaly.

By: Fuad Fatima Tarmann M.D.

## 2019-02-07 IMAGING — US US EXTREM LOW VENOUS*R*
1 series · 13 of 24 positions shown · non-contrast
Comparison: None

CLINICAL DATA: RIGHT lower extremity edema, elevated D-dimer,
question deep venous thrombosis



[Series 1: us extrem low venous*right* · 0.05mm/px · 36 acquisitions, 13 frames shown]
[im 1/36]
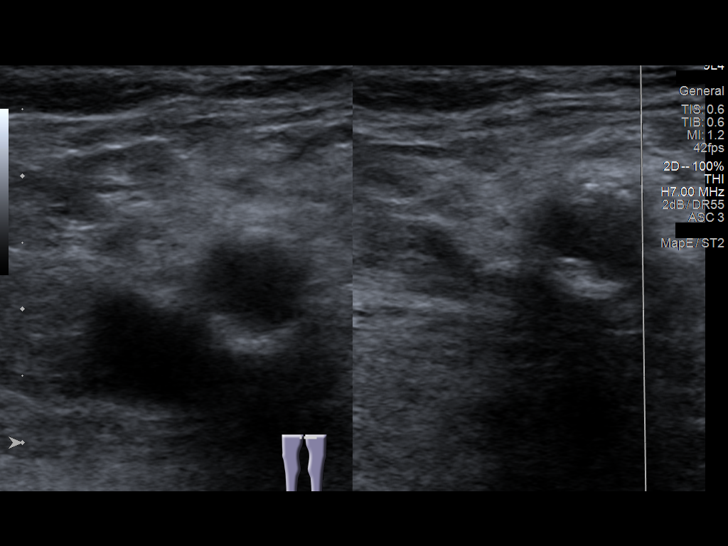
[im 4/36]
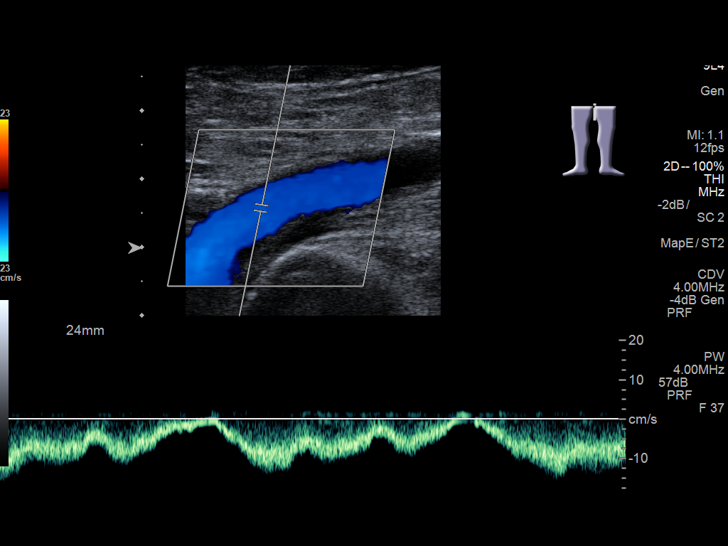
[im 7/36]
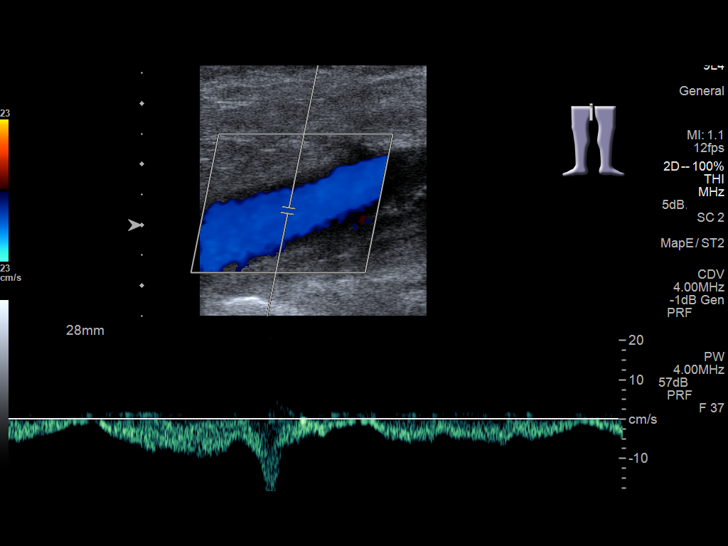
[im 10/36]
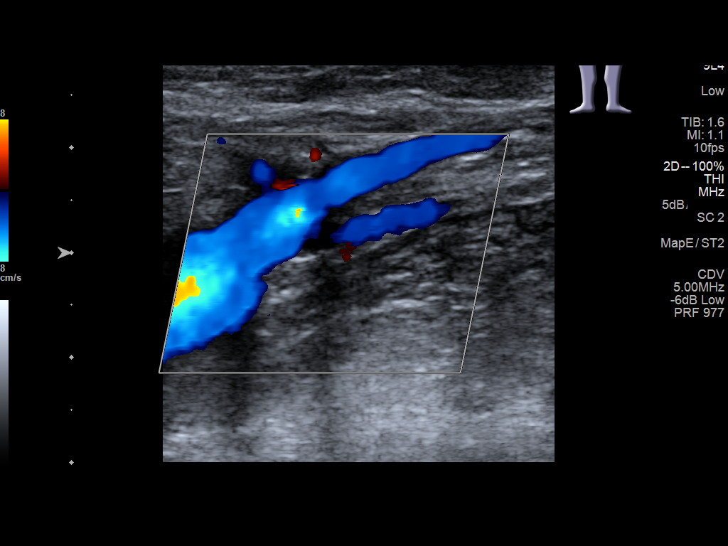
[im 13/36]
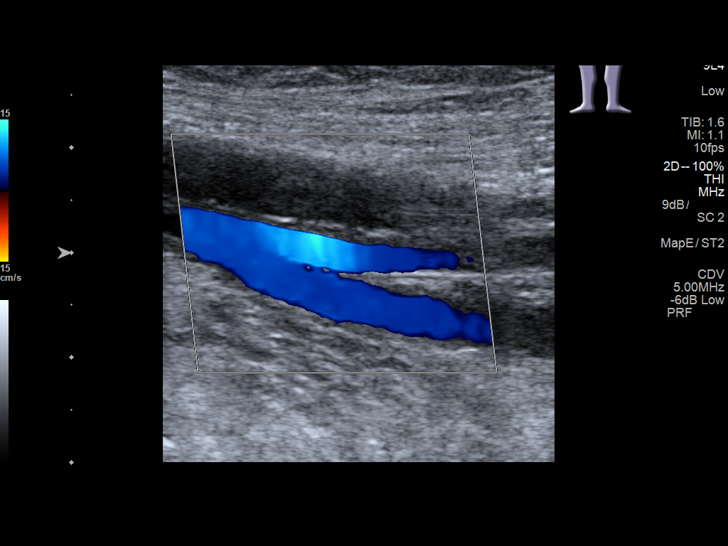
[im 16/36]
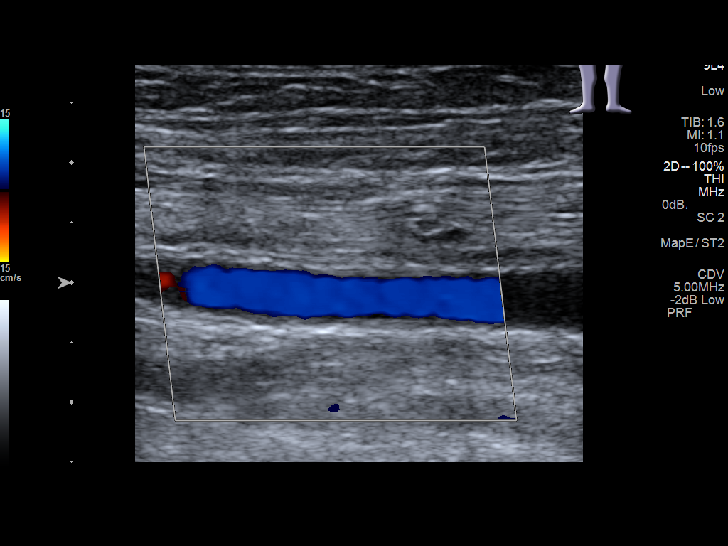
[im 20/36]
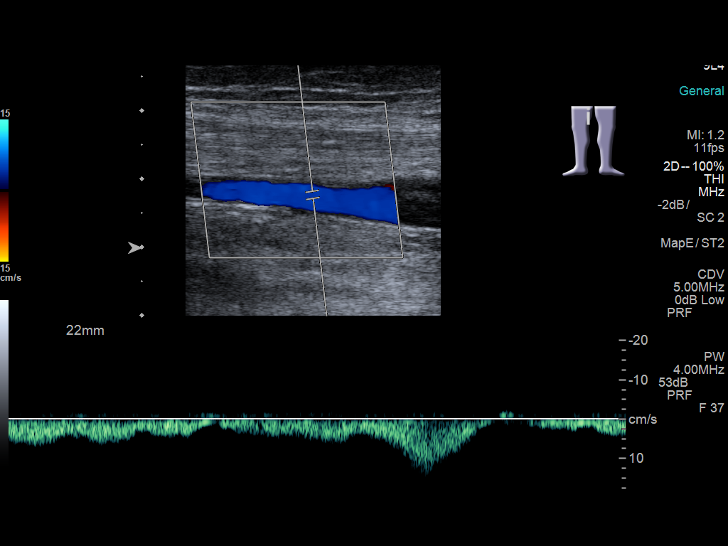
[im 22/36]
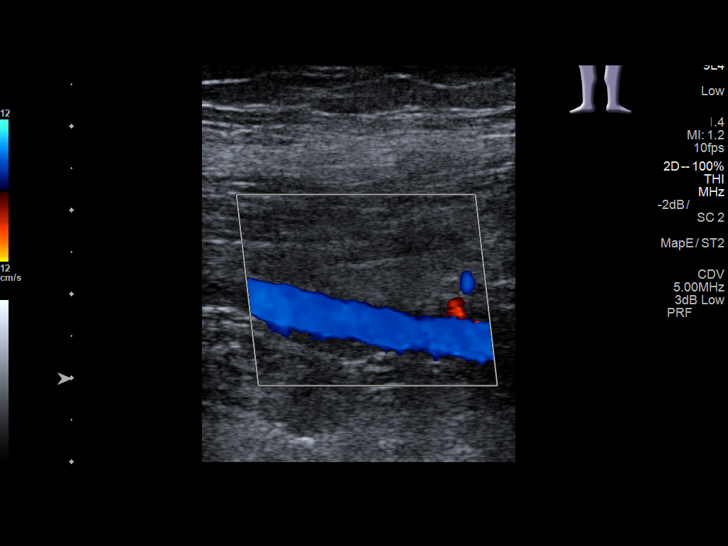
[im 25/36]
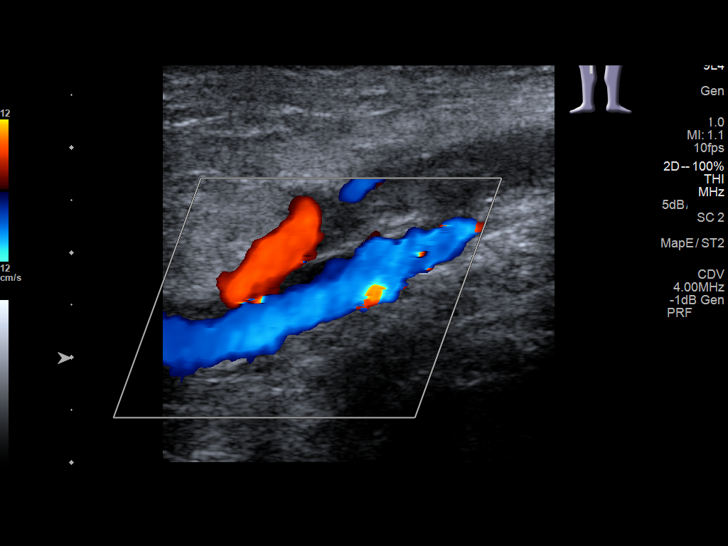
[im 28/36]
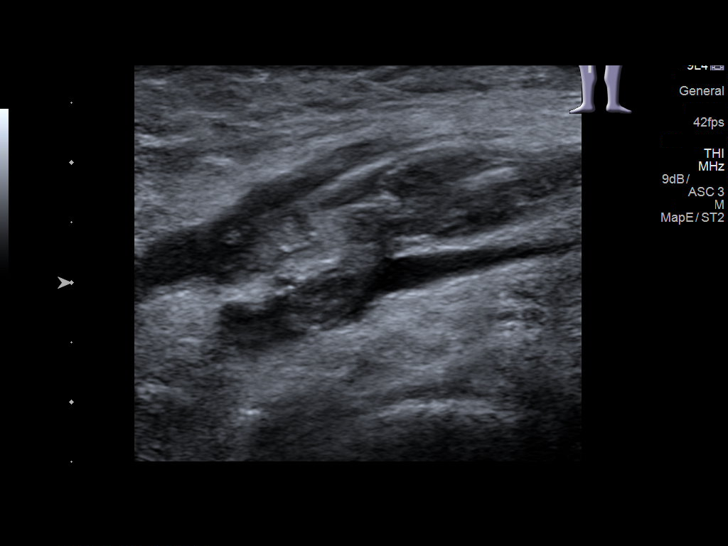
[im 29/36]
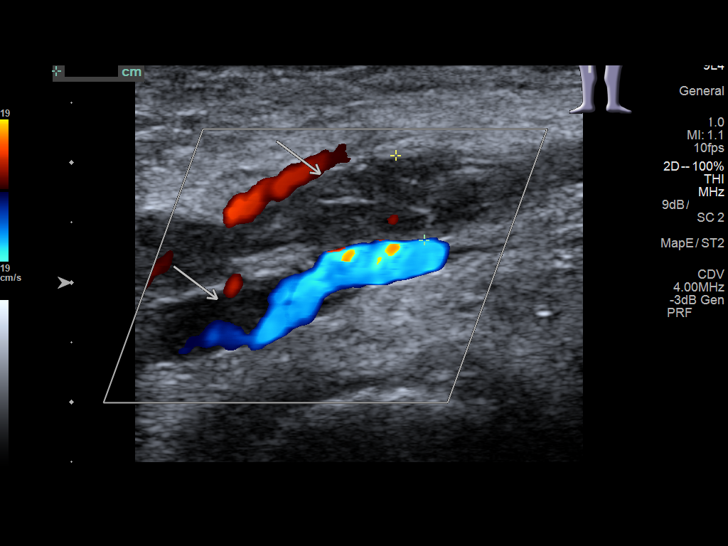
[im 32/36]
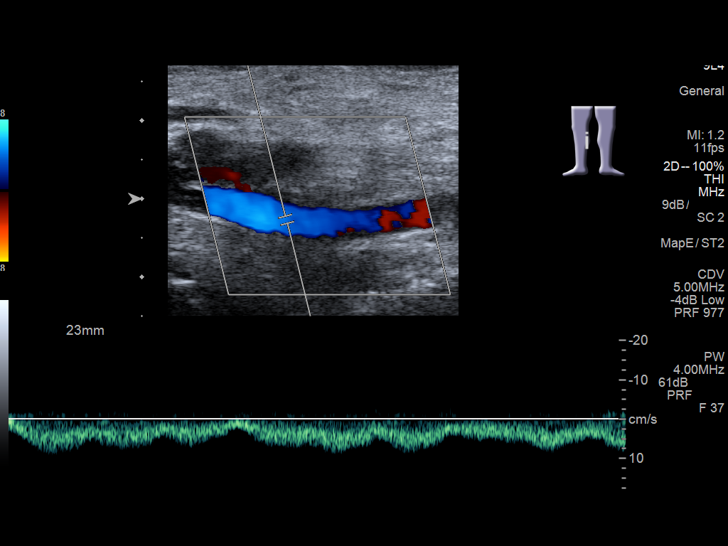
[im 36/36]
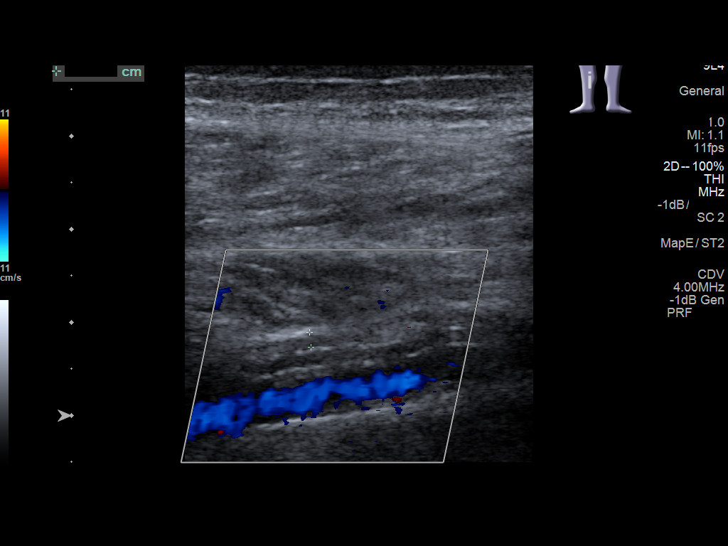

[13 of 24 positions shown; findings below may reference images not displayed]

FINDINGS: Contralateral Common Femoral Vein: Respiratory phasicity is normal
and symmetric with the symptomatic side. No evidence of thrombus.
Normal compressibility.

Common Femoral Vein: No evidence of thrombus. Normal
compressibility, respiratory phasicity and response to augmentation.

Saphenofemoral Junction: No evidence of thrombus. Normal
compressibility and flow on color Doppler imaging.

Profunda Femoral Vein: No evidence of thrombus. Normal
compressibility and flow on color Doppler imaging.

Femoral Vein: No evidence of thrombus. Normal compressibility,
respiratory phasicity and response to augmentation.

Popliteal Vein: Hypoechoic thrombus extends into RIGHT popliteal
vein from the lesser saphenous vein. Impaired spontaneous venous
flow and compressibility at this segment. Remaining portion of the
RIGHT popliteal vein appears patent and compressible.

Calf Veins: Thrombus identified within portions of the RIGHT
posterior tibial and peroneal veins.

Superficial Great Saphenous Vein: No evidence of thrombus. Normal
compressibility.

Venous Reflux:  None.

Other Findings:  Hypoechoic thrombus within lesser saphenous vein.
IMPRESSION: Deep venous thrombosis involving the RIGHT posterior tibial and
peroneal veins with additional thrombus in the RIGHT lesser
saphenous vein which extends into the RIGHT popliteal vein.

## 2019-04-25 DEATH — deceased
